# Patient Record
Sex: Male | Born: 1950 | Race: White | Hispanic: No | Marital: Married | Smoking: Former smoker
Health system: Southern US, Community
[De-identification: ages and names within clinical notes are randomized; demographics above are authoritative.]

## PROBLEM LIST (undated history)

## (undated) DIAGNOSIS — N401 Enlarged prostate with lower urinary tract symptoms: Secondary | ICD-10-CM

## (undated) DIAGNOSIS — N138 Other obstructive and reflux uropathy: Secondary | ICD-10-CM

## (undated) DIAGNOSIS — G4730 Sleep apnea, unspecified: Secondary | ICD-10-CM

## (undated) DIAGNOSIS — Z87891 Personal history of nicotine dependence: Secondary | ICD-10-CM

## (undated) DIAGNOSIS — R351 Nocturia: Secondary | ICD-10-CM

## (undated) DIAGNOSIS — R7989 Other specified abnormal findings of blood chemistry: Secondary | ICD-10-CM

## (undated) DIAGNOSIS — E785 Hyperlipidemia, unspecified: Secondary | ICD-10-CM

## (undated) DIAGNOSIS — M1990 Unspecified osteoarthritis, unspecified site: Secondary | ICD-10-CM

## (undated) DIAGNOSIS — R011 Cardiac murmur, unspecified: Secondary | ICD-10-CM

## (undated) HISTORY — DX: Sleep apnea, unspecified: G47.30

## (undated) HISTORY — DX: Unspecified osteoarthritis, unspecified site: M19.90

## (undated) HISTORY — PX: OTHER SURGICAL HISTORY: SHX169

## (undated) HISTORY — DX: Personal history of nicotine dependence: Z87.891

## (undated) HISTORY — DX: Hyperlipidemia, unspecified: E78.5

## (undated) HISTORY — DX: Nocturia: R35.1

## (undated) HISTORY — DX: Cardiac murmur, unspecified: R01.1

## (undated) HISTORY — DX: Other specified abnormal findings of blood chemistry: R79.89

---

## 1970-04-07 HISTORY — PX: HERNIA REPAIR: SHX51

## 1981-04-07 HISTORY — PX: VASECTOMY: SHX75

## 2005-04-07 HISTORY — PX: MOHS SURGERY: SUR867

## 2013-06-01 ENCOUNTER — Encounter: Payer: Self-pay | Admitting: Emergency Medicine

## 2013-06-01 ENCOUNTER — Encounter: Payer: Self-pay | Admitting: Family Medicine

## 2013-06-01 ENCOUNTER — Ambulatory Visit (INDEPENDENT_AMBULATORY_CARE_PROVIDER_SITE_OTHER): Payer: 59 | Admitting: Family Medicine

## 2013-06-01 ENCOUNTER — Ambulatory Visit (INDEPENDENT_AMBULATORY_CARE_PROVIDER_SITE_OTHER): Payer: 59 | Admitting: Emergency Medicine

## 2013-06-01 VITALS — BP 127/82 | HR 76 | Temp 97.9°F | Ht 69.0 in | Wt 210.5 lb

## 2013-06-01 VITALS — BP 131/82 | Ht 69.0 in | Wt 210.0 lb

## 2013-06-01 DIAGNOSIS — M25569 Pain in unspecified knee: Secondary | ICD-10-CM

## 2013-06-01 DIAGNOSIS — M25561 Pain in right knee: Secondary | ICD-10-CM

## 2013-06-01 DIAGNOSIS — R4589 Other symptoms and signs involving emotional state: Secondary | ICD-10-CM

## 2013-06-01 DIAGNOSIS — E785 Hyperlipidemia, unspecified: Secondary | ICD-10-CM

## 2013-06-01 DIAGNOSIS — F341 Dysthymic disorder: Secondary | ICD-10-CM

## 2013-06-01 NOTE — Assessment & Plan Note (Addendum)
Patient endorsing Right medial knee pain on exam.   Crepitus noted. Obtaining standing xray's today.  Patient to go to [facility] for [place] of knee to assess for underlying medial meniscus degeneration/tear. Patient declined NSAID's today.   Patient to follow up in 1 month.  After visit:  - I spoke with Dr. Meredith Staggers ([facility] fellow).  US revealed bulging right medial meniscus. No tear identified.

## 2013-06-01 NOTE — Progress Notes (Signed)
Subjective:    Patient ID: Cesar Green, male    DOB: 1950/04/10, 63 y.o.   MRN: [id number]  HPI Cesar Green presents to the clinic today to establish care.  PMH, Surgical Hx, Family Hx, Social Hx, Meds/Allergies reviewed (see below).  Past Medical History  Diagnosis Date  . HLD (hyperlipidemia)   . History of tobacco abuse     Quit 1976   Past Surgical History  Procedure Laterality Date  . Hernia repair  1972   Family History  Problem Relation Age of Onset  . Cancer Mother   . Heart failure Father   . Diabetes Father   . Depression Father   . Hypertension Mother   . Hypertension Father   . Hypertension Sister    History   Social History  . Marital Status: Married    Spouse Name: N/A    Number of Children: N/A  . Years of Education: N/A   Social History Main Topics  . Smoking status: Former Research scientist (life sciences)  . Smokeless tobacco: Not on file     Comment: Quit 1976  . Alcohol Use: Yes     Comment: Rarely; May have a been every 6 months  . Drug Use: No  . Sexual Activity: Yes   Other Topics Concern  . Not on file   Social History Narrative   Works for [employer].   Contact info: [phone]         Current outpatient prescriptions:aspirin 81 MG tablet, Take 81 mg by mouth daily., Disp: , Rfl: ;  pravastatin (PRAVACHOL) 80 MG tablet, , Disp: , Rfl:   No Known Allergies  Current issues:  1) Joint pain - Knee pain - Patient reports right knee pain (primary concern) - Has been going on for > 1 year - No recent injury or trauma - Pain moderate in severity and located medially. Pain is intermittent but has been worsening.  He is now unable to due his prior regular physical activity (cardio and martial arts). - He also notes that his knee feels "unsteady" - Pain improves slightly with PRN Tylenol Arthritis - He is eager to get this improved so that he can return to exercising regularly.  2) Sadness - Patient reports recent sadness  - Mood is now beginning to impact  sleep and personal relationships - Recent stressors: daughter has been sick (she is disabled), recent job change and relocation to the area. - No HI or SI - Believes this has worsened as he is now sedentary and unable to do regular physical activity (which is his personal time and stress reliever)   Review of Systems General:  Negative for nexplained weight loss, fever Skin: Negative for new or changing mole, sore that won't heal HEENT: Negative for trouble hearing, trouble seeing, mouth sores, hoarseness, change in voice, dysphagia. Reports longstanding tinnitus CV:  Negative for chest pain, dyspnea, edema, palpitations Resp: Negative for cough, dyspnea, hemoptysis GI: Negative for nausea, vomiting, diarrhea, constipation, abdominal pain, melena, hematochezia. GU: Negative for dysuria, incontinence, urinary hesitance, hematuria, vaginal or penile discharge, polyuria, sexual difficulty, lumps in testicle or breasts MSK: Negative for muscle cramps or aches. Reports joint pain/swelling.  Neuro: Negative for headaches, weakness, numbness, dizziness, passing out/fainting Psych: Reports sadness.   Objective:   Physical Exam Filed Vitals:   06/01/13 1338  BP: 127/82  Pulse: 76  Temp: 97.9 F (36.6 C)   Exam: General: well appearing gentleman, NAD; pleasant and cooperative. Cardiovascular: RRR. No murmurs, rubs, or gallops. Respiratory:  CTAB. No rales, rhonchi, or wheeze. Abdomen: soft, nontender, nondistended. Extremities: No LE edema. MSK:  Knee:  Normal to inspection with no erythema or effusion or obvious bony abnormalities.  Palpation normal with no warmth. Medial joint line tenderness.   Normal ROM in flexion and extension.  Ligaments with solid consistent endpoints including ACL, PCL, LCL, MCL.  Negative Mcmurray's.  Non painful patellar compression.  Crepitus noted with flexion/extension of knee.   Patellar and quadriceps tendons unremarkable. Psych: flat affect noted.       Assessment & Plan:  See Problem List

## 2013-06-01 NOTE — Assessment & Plan Note (Signed)
US showed question of degenerative changes to medial meniscus without definitive tear seen.   Patient awaiting xrays.  Given Body Helix compression sleeve.  Follow up with [facility] or Dr. Lacinda Axon after xrays and consider further imaging vs. inj therapy at that time.

## 2013-06-01 NOTE — Patient Instructions (Signed)
It was nice to see you today.  I am ordering an xray of your knee to further evaluate.  I have prescribed Mobic for your knee pain.  Follow up with me in ~ 1 month following your Sports medicine visit.   Continue taking your medications as prescribed.

## 2013-06-01 NOTE — Assessment & Plan Note (Addendum)
Likely secondary to recent life stressors.  No indication for pharmacotherapy currently. Will continue to monitor closely.

## 2013-06-01 NOTE — Assessment & Plan Note (Signed)
Continuing Pravachol 80 mg daily.

## 2013-06-01 NOTE — Progress Notes (Signed)
Patient ID: Cesar Green, male   DOB: [birth year], 63 y.o.   MRN: [id number] 63 yo male with right knee pain sent from family medicine for evaluation of right knee pain and ultrasound to evaluate for meniscal injury. Pain gradually worsening in knee.  No significant swelling.  Limiting his ability to run due to pain.  Onset after martial arts, although no specifice inciting event.  PPMH:  Hyperlipidemia, no diabetes.  ROS:  As per HPI, otherwise negative.  Examination: BP 131/82  Ht 5\' 9"  (1.753 m)  Wt 210 lb (95.255 kg)  BMI 31.00 kg/m2. Right Knee: Normal to inspection with no erythema or effusion or obvious bony abnormalities. Palpation with joint line tenderness medially ROM normal in flexion and extension and lower leg rotation. Ligaments with solid consistent endpoints including ACL, PCL, LCL, MCL. Equivocal Mcmurray's and provocative meniscal tests. Non painful patellar compression. Patellar and quadriceps tendons unremarkable. Hamstring and quadriceps strength is normal.  MSK [place] of right knee reveals buldging of medial mensicus with small amount of fluid.  No discreet tear is seen however.  Normal lateral meniscus and patellar tendon.

## 2013-06-10 ENCOUNTER — Other Ambulatory Visit: Payer: Self-pay | Admitting: Family Medicine

## 2013-06-10 ENCOUNTER — Ambulatory Visit
Admission: RE | Admit: 2013-06-10 | Discharge: 2013-06-10 | Disposition: A | Discharge status: Home / Self Care | Payer: 59 | Source: Ambulatory Visit | Attending: Family Medicine | Admitting: Family Medicine

## 2013-06-10 DIAGNOSIS — M25569 Pain in unspecified knee: Secondary | ICD-10-CM

## 2013-07-20 ENCOUNTER — Ambulatory Visit (INDEPENDENT_AMBULATORY_CARE_PROVIDER_SITE_OTHER): Payer: 59 | Admitting: Family Medicine

## 2013-07-20 VITALS — BP 122/64 | HR 76 | Temp 98.2°F | Ht 69.0 in | Wt 204.0 lb

## 2013-07-20 DIAGNOSIS — M25561 Pain in right knee: Secondary | ICD-10-CM

## 2013-07-20 DIAGNOSIS — M25569 Pain in unspecified knee: Secondary | ICD-10-CM

## 2013-07-20 MED ORDER — MELOXICAM 15 MG PO TABS: 15.0000 mg | ORAL_TABLET | Freq: Every day | ORAL | Status: DC

## 2013-07-20 NOTE — Assessment & Plan Note (Signed)
After discussion of therapeutic options patient elected for PRN Anti-inflammatories and continued compression sleeve. Rx for Mobic sent. If patient fails conservative management with proceed with corticosteroid injection.

## 2013-07-20 NOTE — Progress Notes (Signed)
   Subjective:    Patient ID: Marq Rebello, male    DOB: [birth year], 63 y.o.   MRN: [id number]  HPI 63 year old male presents for follow up of right knee pain.  1) Right Knee Pain - Patient reports intermittent and sharp R medial knee pain; 7-8/10 in severity.  No radiation. - Pain is worse with certain movements/activities. - He also reports that he has a frequent dull ache as well (2/10 in severity). - Associated symptoms: knee "buckling" with the above sharp pain.  No reported weakness. - Pain somewhat improved with sleeve/bracing  Review of Systems Per HPI    Objective:   Physical Exam Filed Vitals:   07/20/13 1346  BP: 122/64  Pulse: 76  Temp: 98.2 F (36.8 C)   General: well appearing male in NAD MSK: Knee: Normal to inspection with no erythema or effusion or obvious bony abnormalities. Palpation normal with no warmth, joint line tenderness, patellar tenderness, or condyle tenderness. ROM full in flexion and extension and lower leg rotation. Ligaments with solid consistent endpoints including ACL, PCL, LCL, MCL. Negative Mcmurray's. +Crepitus with flexion/extension. Patellar and quadriceps tendons unremarkable.    Assessment & Plan:  See Problem List

## 2013-07-20 NOTE — Patient Instructions (Signed)
It was nice to see you today.  Please take the Mobic daily as needed for knee pain/arthritis.  Follow up with me annually as needed.

## 2013-12-23 ENCOUNTER — Encounter: Payer: Self-pay | Admitting: Family Medicine

## 2013-12-23 ENCOUNTER — Ambulatory Visit (INDEPENDENT_AMBULATORY_CARE_PROVIDER_SITE_OTHER): Payer: 59 | Admitting: Family Medicine

## 2013-12-23 VITALS — BP 127/78 | HR 64 | Ht 69.0 in | Wt 197.0 lb

## 2013-12-23 DIAGNOSIS — E785 Hyperlipidemia, unspecified: Secondary | ICD-10-CM

## 2013-12-23 DIAGNOSIS — F3289 Other specified depressive episodes: Secondary | ICD-10-CM

## 2013-12-23 DIAGNOSIS — R4589 Other symptoms and signs involving emotional state: Secondary | ICD-10-CM

## 2013-12-23 DIAGNOSIS — Z0000 Encounter for general adult medical examination without abnormal findings: Secondary | ICD-10-CM

## 2013-12-23 DIAGNOSIS — Z23 Encounter for immunization: Secondary | ICD-10-CM

## 2013-12-23 DIAGNOSIS — E663 Overweight: Secondary | ICD-10-CM

## 2013-12-23 DIAGNOSIS — F329 Major depressive disorder, single episode, unspecified: Secondary | ICD-10-CM

## 2013-12-23 DIAGNOSIS — M25561 Pain in right knee: Secondary | ICD-10-CM

## 2013-12-23 DIAGNOSIS — G4700 Insomnia, unspecified: Secondary | ICD-10-CM | POA: Insufficient documentation

## 2013-12-23 DIAGNOSIS — Z79899 Other long term (current) drug therapy: Secondary | ICD-10-CM

## 2013-12-23 DIAGNOSIS — M25569 Pain in unspecified knee: Secondary | ICD-10-CM

## 2013-12-23 LAB — COMPLETE METABOLIC PANEL WITH GFR
ALT: 23 U/L (ref 0–53)
AST: 22 U/L (ref 0–37)
Albumin: 4.5 g/dL (ref 3.5–5.2)
Alkaline Phosphatase: 77 U/L (ref 39–117)
BILIRUBIN TOTAL: 0.7 mg/dL (ref 0.2–1.2)
BUN: 14 mg/dL (ref 6–23)
CO2: 28 meq/L (ref 19–32)
Calcium: 9.4 mg/dL (ref 8.4–10.5)
Chloride: 105 mEq/L (ref 96–112)
Creat: 1.01 mg/dL (ref 0.50–1.35)
GFR, EST NON AFRICAN AMERICAN: 78 mL/min
GLUCOSE: 92 mg/dL (ref 70–99)
Potassium: 4.2 mEq/L (ref 3.5–5.3)
Sodium: 142 mEq/L (ref 135–145)
Total Protein: 6.8 g/dL (ref 6.0–8.3)

## 2013-12-23 LAB — LIPID PANEL
CHOL/HDL RATIO: 3.7 ratio
CHOLESTEROL: 180 mg/dL (ref 0–200)
HDL: 49 mg/dL (ref >39)
LDL Cholesterol: 109 mg/dL — ABNORMAL HIGH (ref 0–99)
Triglycerides: 111 mg/dL (ref <150)
VLDL: 22 mg/dL (ref 0–40)

## 2013-12-23 LAB — CBC
HCT: 40.1 % (ref 39.0–52.0)
Hemoglobin: 14.1 g/dL (ref 13.0–17.0)
MCH: 29.7 pg (ref 26.0–34.0)
MCHC: 35.2 g/dL (ref 30.0–36.0)
MCV: 84.6 fL (ref 78.0–100.0)
PLATELETS: 287 10*3/uL (ref 150–400)
RBC: 4.74 MIL/uL (ref 4.22–5.81)
RDW: 13.7 % (ref 11.5–15.5)
WBC: 7.9 10*3/uL (ref 4.0–10.5)

## 2013-12-23 MED ORDER — ZOSTER VACCINE LIVE 19400 UNT/0.65ML ~~LOC~~ SOLR: 0.6500 mL | Freq: Once | SUBCUTANEOUS | Status: DC

## 2013-12-23 NOTE — Progress Notes (Signed)
   Subjective:    Patient ID: Cesar Green, male    DOB: 1950/09/23, 63 y.o.   MRN: [id number]  HPI 63 year old male with PMH of hyperlipidemia presents for an annual physical exam/wellness visit.  Cesar Green reports that he is doing well.  He does have a few issues to discuss today.  1) Difficulty sleeping - Patient reports that he has difficulty staying asleep. - He reports that he falls asleep easily but awakens early. - He is unclear of any potential triggers.   - He states that this has been going on for months.  - Denies exercise/physical activity before bed, reading/watching TV in bed.  He does note that his mind is restless and that he does drink a substantial amount of caffeine.  2) Irritability/Intermittently feeling down - He notes that he wife frequently reports that he is irritable  - He also reports that he feels down occasion.  He states that this occurs predominantly with things/events that trigger memories of his parents - Mood does not effect his relationships or work life. - No reported loss of interest, guilt, appetite changes, concentration difficulties, etc.  Sleep difficulties as above. - Of note, he states that he was prescribed an antihistamine in the past.   3) Hyperlipidemia - On pravastatin - Denies myalgias, weakness.  4) Preventative care - Colonoscopy performed in 2008. - Patient in need of flu vaccine, Zostavax, Tdap.  PMH, Surgical Hx, Family Hx, Social Hx reviewed. Past Medical History  Diagnosis Date  . HLD (hyperlipidemia)   . History of tobacco abuse     Quit 1976   Past Surgical History  Procedure Laterality Date  . Hernia repair  1972   Family History  Problem Relation Age of Onset  . Cancer Mother   . Heart failure Father   . Diabetes Father   . Depression Father   . Hypertension Mother   . Hypertension Father   . Hypertension Sister    History  Substance Use Topics  . Smoking status: Former Research scientist (life sciences)  . Smokeless tobacco:  Not on file     Comment: Quit 1976  . Alcohol Use: No    Review of Systems  General:  Negative for nexplained weight loss, fever Skin: Negative for new or changing mole, sore that won't heal HEENT: Negative for trouble hearing, trouble seeing, mouth sores, hoarseness, change in voice, dysphagia. + Ringing in the Ears. CV:  Negative for chest pain, dyspnea, edema, palpitations Resp: Negative for cough, dyspnea, hemoptysis GI: Negative for nausea, vomiting, diarrhea, constipation, abdominal pain, melena, hematochezia. GU: Negative for dysuria, incontinence, urinary hesitance, hematuria, vaginal or penile discharge, polyuria, sexual difficulty, lumps in testicle or breasts MSK: Negative for muscle cramps or aches. + Joint pain Neuro: Negative for headaches, weakness, numbness, dizziness, passing out/fainting Psych: Negative for depression, anxiety, memory problems. + Sadness.      Objective:   Physical Exam Filed Vitals:   12/23/13 1434  BP: 127/78  Pulse: 64   General: well appearing male in NAD.  HEENT: NCAT. Normal TM's bilaterally. Oropharynx clear.  Cardiovascular: RRR. No murmurs, rubs, or gallops. Respiratory: CTAB. No rales, rhonchi, or wheeze. Abdomen: soft, nontender, nondistended. No organomegaly.  Extremities: Trace LE edema.  Skin: Warm, dry, intact. Neuro: No focal deficits.     Assessment & Plan:  See Problem List

## 2013-12-23 NOTE — Assessment & Plan Note (Signed)
Patient given sleep hygiene tips/instructions today. Patient did not want pharmacotherapy. Will follow.

## 2013-12-23 NOTE — Assessment & Plan Note (Signed)
Tdap given today.  Rx for Zostavax given.  Colonoscopy needed in 2018.  Discussed PSA and DRE today and current guidelines (USPSTF) which do not advocate for this.  Will need [place] to evaluated for AAA next year (given smoking hx). Patient to get influenza vaccine at work.

## 2013-12-23 NOTE — Patient Instructions (Signed)
It was nice to see you today.  You're doing great.  Continue your statin.  Sleep hygiene: Basic rules for a good night's sleep  Sleep only as much as you need to feel rested and then get out of bed  Keep a regular sleep schedule  Avoid forcing sleep  Exercise regularly for at least 20 minutes, preferably 4 to 5 hours before bedtime  Avoid caffeinated beverages after lunch  Avoid alcohol near bedtime: no "night cap"  Avoid smoking, especially in the evening  Do not go to bed hungry  Adjust bedroom environment  Avoid prolonged use of light-emitting screens before bedtime   Deal with your worries before bedtime   Follow up annually.

## 2013-12-23 NOTE — Assessment & Plan Note (Signed)
Does not seem to be secondary to depression (only positive item of SIGECAPS is sleep change).  Related to things that trigger memories of family.  No indication for pharmacotherapy. Will continue to follow closely.

## 2013-12-23 NOTE — Assessment & Plan Note (Signed)
Lipid panel today

## 2013-12-24 ENCOUNTER — Encounter: Payer: Self-pay | Admitting: Family Medicine

## 2013-12-28 ENCOUNTER — Telehealth: Payer: Self-pay | Admitting: *Deleted

## 2013-12-28 NOTE — Telephone Encounter (Signed)
Left voice message that paper work is completed and ready for pick up.  Derl Barrow, RN

## 2014-05-18 ENCOUNTER — Encounter: Payer: Self-pay | Admitting: Family Medicine

## 2014-05-18 ENCOUNTER — Ambulatory Visit (INDEPENDENT_AMBULATORY_CARE_PROVIDER_SITE_OTHER): Payer: Self-pay | Admitting: Family Medicine

## 2014-05-18 VITALS — BP 121/71 | HR 71 | Temp 98.3°F | Ht 69.0 in | Wt 202.0 lb

## 2014-05-18 DIAGNOSIS — G4700 Insomnia, unspecified: Secondary | ICD-10-CM

## 2014-05-18 DIAGNOSIS — R519 Headache, unspecified: Secondary | ICD-10-CM | POA: Insufficient documentation

## 2014-05-18 DIAGNOSIS — R51 Headache: Secondary | ICD-10-CM

## 2014-05-18 MED ORDER — PRAVASTATIN SODIUM 80 MG PO TABS: 80.0000 mg | ORAL_TABLET | Freq: Every day | ORAL | Status: DC

## 2014-05-18 MED ORDER — NORTRIPTYLINE HCL 25 MG PO CAPS: 25.0000 mg | ORAL_CAPSULE | Freq: Every day | ORAL | Status: DC

## 2014-05-18 NOTE — Assessment & Plan Note (Signed)
I feel that this is secondary to mild depression given history and PHQ9 of 7. Will treat with nortriptyline 25 mg prior to bed and have patient follow closely.

## 2014-05-18 NOTE — Patient Instructions (Signed)
It was nice to see you today.  I have prescribed Nortriptyline for your insomnia/potential depression.  Follow up with me in ~ 1-2 months (either in the office or via phone).  Take care  Dr. Lacinda Axon

## 2014-05-18 NOTE — Assessment & Plan Note (Signed)
Patient recent headache 1. He reports that this headache was different than once had in the past. History suggestive of migraine headache. Given the fact that this was 1 isolated event, I advised him to use NSAIDs and if this recurs to follow up with me as imaging will likely be in order given his age and reported new type of headache.

## 2014-05-18 NOTE — Progress Notes (Signed)
   Subjective:    Patient ID: Cesar Green, male    DOB: March 29, 1951, 64 y.o.   MRN: [id number]  HPI 64 year old male with hyperlipidemia presents to the clinic today with complaints of insomnia and recent headache.  1) Insomnia  Patient reports that for the past 3 months she's been experiencing difficulty sleeping.  Patient reports that he does not have any trouble falling asleep but that he has difficulty staying asleep.  He reports he falls asleep easily and then sleeps for 2-3 hours and awakens.  Additionally, patient reports that he has been more irritable/snappy over these past 3 months. He states that this is impacting his work as well as his home life.  Also, his wife has expressed concern that he may be depressed.  She has noticed that he has had little interest in doing things.  Patient states that he does not feel down or depressed. He does note that he occasionally feels down but nothing out of the ordinary. As indicated above he does note he has been more irritable and is been experiencing lack of energy (he attributes this to insomnia).  He also reports that he had some difficulty concentrating at times.  He denies guilt, lack of interest/pleasure, suicidal ideation, appetite change.  2) Headache  Patient report he recently had a severe headache.  He reports that this was different from his prior.   He states that it was located in the glabellar region.  Pain was sharp and throbbing in character.  Questionable mild photophobia.  No reported nausea.  Pain was 8-9 out of 10 in severity.  Patient took Excedrin with improvement.  He has had no further headaches.  Review of Systems Per HPI    Objective:   Physical Exam Filed Vitals:   05/18/14 1350  BP: 121/71  Pulse: 71  Temp: 98.3 F (36.8 C)   Exam: General: well appearing, NAD. HEENT: NCAT. PEERLA.  Cardiovascular: RRR. No murmurs, rubs, or gallops. Respiratory: CTAB. No rales, rhonchi, or  wheeze. Abdomen: soft, nontender, nondistended. Neuro: CN 2-12 intact. 5/5 muscle strength. Sensation intact.    Assessment & Plan:  See Problem List

## 2014-06-02 ENCOUNTER — Other Ambulatory Visit: Payer: Self-pay | Admitting: *Deleted

## 2014-06-02 MED ORDER — PRAVASTATIN SODIUM 80 MG PO TABS: 80.0000 mg | ORAL_TABLET | Freq: Every day | ORAL | Status: DC

## 2014-06-02 MED ORDER — MELOXICAM 15 MG PO TABS: 15.0000 mg | ORAL_TABLET | Freq: Every day | ORAL | Status: DC

## 2014-06-02 MED ORDER — NORTRIPTYLINE HCL 25 MG PO CAPS: 25.0000 mg | ORAL_CAPSULE | Freq: Every day | ORAL | Status: DC

## 2014-07-03 ENCOUNTER — Other Ambulatory Visit: Payer: Self-pay | Admitting: Family Medicine

## 2014-12-15 ENCOUNTER — Encounter: Payer: Self-pay | Admitting: Family Medicine

## 2014-12-15 ENCOUNTER — Ambulatory Visit (INDEPENDENT_AMBULATORY_CARE_PROVIDER_SITE_OTHER): Payer: 59 | Admitting: Family Medicine

## 2014-12-15 VITALS — BP 135/69 | HR 63 | Temp 98.1°F | Ht 69.0 in | Wt 196.0 lb

## 2014-12-15 DIAGNOSIS — R3911 Hesitancy of micturition: Secondary | ICD-10-CM | POA: Diagnosis not present

## 2014-12-15 DIAGNOSIS — E785 Hyperlipidemia, unspecified: Secondary | ICD-10-CM | POA: Diagnosis not present

## 2014-12-15 DIAGNOSIS — Z0000 Encounter for general adult medical examination without abnormal findings: Secondary | ICD-10-CM | POA: Diagnosis not present

## 2014-12-15 NOTE — Assessment & Plan Note (Signed)
Up-to-date on colonoscopy and vaccines Lipid panel, bmet, A1c, hep C, HIV screening next week Patient declined prostate exam Follow-up in one year for next annual physical

## 2014-12-15 NOTE — Progress Notes (Signed)
   Subjective:   Cesar Green is a 64 y.o. male with a history of HLD, overweight here for annual physical.  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 All other systems reviewed and are negative.   PMH, PSH, Medications, Allergies, and FmHx reviewed and updated in EMR.  Social History: former smoker - 9 pack year history, quit 40 yrs ago  Objective:  BP 135/69 mmHg  Pulse 63  Temp(Src) 98.1 F (36.7 C) (Oral)  Ht 5\' 9"  (1.753 m)  Wt 196 lb (88.905 kg)  BMI 28.93 kg/m2  Gen:  64 y.o. male in NAD HEENT: NCAT, MMM, EOMI, PERRL, anicteric sclerae CV: RRR, no MRG Resp: Non-labored, CTAB, no wheezes noted Abd: Soft, NTND, BS present, no guarding or organomegaly Ext: WWP, no edema MSK: Full ROM, strength intact Neuro: Alert and oriented, speech normal     Assessment & Plan:     Cesar Green is a 64 y.o. male here for annual physical.  Hyperlipidemia Lipid panel on 9/19 Continue pravastatin 80 mg daily Continue aspirin 81 mg daily  Preventative health care Up-to-date on colonoscopy and vaccines Lipid panel, bmet, A1c, hep C, HIV screening next week Patient declined prostate exam Follow-up in one year for next annual physical  Urinary hesitancy Likely related to BPH Declined prostate exam today We'll continue to monitor and start Flomax if quality of life is more affected    Cesar Crews, MD  MPH PGY-2,  [facility] 12/15/2014  12:11 PM

## 2014-12-15 NOTE — Assessment & Plan Note (Signed)
Likely related to BPH Declined prostate exam today We'll continue to monitor and start Flomax if quality of life is more affected

## 2014-12-15 NOTE — Assessment & Plan Note (Signed)
Lipid panel on 9/19 Continue pravastatin 80 mg daily Continue aspirin 81 mg daily

## 2014-12-15 NOTE — Patient Instructions (Signed)
Nice to see you again today. We will get labs after than a year since last time he had them. Please make a lab appointment for 12/25/14 or later for these labs. I will send you a letter call you with the results when they're available.  Come back to see me in one year for your next physical or sooner if any problems arise.  Take care,  Dr. B  Things to do to keep yourself healthy  - Exercise at least 30-45 minutes a day, 3-4 days a week.  - Eat a low-fat diet with lots of fruits and vegetables, up to 7-9 servings per day.  - Seatbelts can save your life. Wear them always.  - Smoke detectors on every level of your home, check batteries every year.  - Eye Doctor - have an eye exam every 1-2 years  - Safe sex - if you may be exposed to STDs, use a condom.  - Alcohol -  If you drink, do it moderately, less than 2 drinks per day.  - [place]. Choose someone to speak for you if you are not able.  - Depression is common in our stressful world.If you're feeling down or losing interest in things you normally enjoy, please come in for a visit.  - Violence - If anyone is threatening or hurting you, please call immediately.

## 2014-12-25 ENCOUNTER — Other Ambulatory Visit (INDEPENDENT_AMBULATORY_CARE_PROVIDER_SITE_OTHER): Payer: 59

## 2014-12-25 DIAGNOSIS — E785 Hyperlipidemia, unspecified: Secondary | ICD-10-CM

## 2014-12-25 DIAGNOSIS — Z0000 Encounter for general adult medical examination without abnormal findings: Secondary | ICD-10-CM

## 2014-12-25 LAB — LIPID PANEL
CHOL/HDL RATIO: 4 ratio (ref <=5.0)
Cholesterol: 185 mg/dL (ref 125–200)
HDL: 46 mg/dL (ref >=40)
LDL CALC: 114 mg/dL (ref <130)
Triglycerides: 123 mg/dL (ref <150)
VLDL: 25 mg/dL (ref <30)

## 2014-12-25 LAB — BASIC METABOLIC PANEL WITH GFR
BUN: 13 mg/dL (ref 7–25)
CHLORIDE: 106 mmol/L (ref 98–110)
CO2: 28 mmol/L (ref 20–31)
CREATININE: 0.95 mg/dL (ref 0.70–1.25)
Calcium: 8.6 mg/dL (ref 8.6–10.3)
GFR, Est African American: >89 mL/min (ref >=60)
GFR, Est Non African American: 84 mL/min (ref >=60)
GLUCOSE: 89 mg/dL (ref 65–99)
Potassium: 4.1 mmol/L (ref 3.5–5.3)
SODIUM: 144 mmol/L (ref 135–146)

## 2014-12-25 LAB — POCT GLYCOSYLATED HEMOGLOBIN (HGB A1C): HEMOGLOBIN A1C: 6

## 2014-12-25 NOTE — Progress Notes (Signed)
Cesar Green

## 2014-12-26 ENCOUNTER — Encounter: Payer: Self-pay | Admitting: Family Medicine

## 2014-12-26 LAB — HIV ANTIBODY (ROUTINE TESTING W REFLEX): HIV: NONREACTIVE

## 2014-12-26 LAB — HEPATITIS C ANTIBODY: HCV AB: NEGATIVE

## 2015-05-24 ENCOUNTER — Telehealth: Payer: Self-pay | Admitting: *Deleted

## 2015-05-24 NOTE — Telephone Encounter (Signed)
Called patient to offer flu vaccine. Patient states he received flu vaccine at [employer] (workplace) mid-October, 2016. Added to historical immunization record. Velora Heckler, RN

## 2015-06-05 ENCOUNTER — Other Ambulatory Visit: Payer: Self-pay | Admitting: *Deleted

## 2015-06-06 MED ORDER — PRAVASTATIN SODIUM 80 MG PO TABS: 80.0000 mg | ORAL_TABLET | Freq: Every day | ORAL | Status: DC

## 2015-07-26 ENCOUNTER — Other Ambulatory Visit: Payer: Self-pay | Admitting: Family Medicine

## 2015-07-26 MED ORDER — MELOXICAM 15 MG PO TABS: ORAL_TABLET | ORAL | Status: DC

## 2015-09-18 ENCOUNTER — Other Ambulatory Visit: Payer: Self-pay | Admitting: Family Medicine

## 2015-12-26 ENCOUNTER — Ambulatory Visit (INDEPENDENT_AMBULATORY_CARE_PROVIDER_SITE_OTHER): Payer: 59 | Admitting: Family Medicine

## 2015-12-26 ENCOUNTER — Encounter: Payer: Self-pay | Admitting: Family Medicine

## 2015-12-26 ENCOUNTER — Telehealth: Payer: Self-pay | Admitting: *Deleted

## 2015-12-26 VITALS — BP 112/67 | HR 65 | Temp 97.8°F | Ht 69.0 in | Wt 198.8 lb

## 2015-12-26 DIAGNOSIS — Z23 Encounter for immunization: Secondary | ICD-10-CM

## 2015-12-26 DIAGNOSIS — Z0000 Encounter for general adult medical examination without abnormal findings: Secondary | ICD-10-CM

## 2015-12-26 DIAGNOSIS — E349 Endocrine disorder, unspecified: Secondary | ICD-10-CM

## 2015-12-26 DIAGNOSIS — E291 Testicular hypofunction: Secondary | ICD-10-CM

## 2015-12-26 MED ORDER — TESTOSTERONE 2 MG/24HR TD PT24: 2.0000 mg | MEDICATED_PATCH | Freq: Every day | TRANSDERMAL | 1 refills | Status: DC

## 2015-12-26 NOTE — Assessment & Plan Note (Addendum)
Testosterone level low 182 on recent labs drawn at work (11/19/15) -was told by nurse at work to discuss with PCP -symptomatic with low energy levels, loss of interest in activities he once enjoyed, however sex drive has not been diminished -prescribed testosterone patch 2 mg daily today -continue to monitor -counseled on adverse effects of medication -follow up in 3 months to recheck testosterone level and evaluate for clinical improvement

## 2015-12-26 NOTE — Progress Notes (Signed)
Subjective:   Patient ID: Cesar Green    DOB: [birth year], 65 y.o. male   MRN: [id number]  CC: routine follow-up and concerns for low testosterone on labs  HPI: Cesar Green is a 65 y.o. male who presents to clinic today for a routine checkup and concerns for low testosterone.  He had his blood work done recently at work and was told by the nurse that his cholesterol numbers were high and his testosterone was "bottomed out."  He states he had not properly fasted prior to those labs and is here today fasted since last night to repeat them.  He states he has been feeling very irritable and tired over the past few years.  He has always looked forward to exercise and does so daily.  However, lately he feels as though he has to force himself to even get to the gym.  States he also has lost interest in other hobbies he used to enjoy such as woodwork and activities with his wife ie shopping and reading. He states his sex drive has not diminished however and he continues to have a good relationship with his wife.  Has been married x41 years.  Quit smoking 40 years ago.  He reports feeling tired all the time and feels as though he gets poor quality sleep. As a result ends up feeling irritable and groggy at work.  Denies feeling depressed and does not feel as though these symptoms interfere with his interpersonal relationships at home or at work.  He reports he was out of shape about 8 years ago and lost a significant amount of weight.  He went down on his weight from 217 lbs to 169 lbs.  Currently 199 lbs.  States that back then it was much easier for him to lose weight and nowadays even though he exercises and tries to eat well he has been unable to lose a few pounds.  Reports he eats lean meats, loves vegetables, whey protein, and has cut down on sugary foods he enjoys such as ice cream. Appears to have a good understanding of his health and is on the right track with his diet and exercise.  ROS: See HPI  for pertinent ROS. All other systems reviewed and negative.   Charlo: Pertinent past medical, surgical, family, and social history were reviewed and updated as appropriate. Smoking status reviewed.  Medications reviewed. Current Outpatient Prescriptions  Medication Sig Dispense Refill  . aspirin 81 MG tablet Take 81 mg by mouth daily.    . meloxicam (MOBIC) 15 MG tablet Take 1 tablet by mouth  daily 90 tablet 1  . nortriptyline (PAMELOR) 25 MG capsule Take 1 capsule (25 mg total) by mouth daily. 30 capsule 3  . pravastatin (PRAVACHOL) 80 MG tablet Take 1 tablet (80 mg total) by mouth daily. 90 tablet 3  . Testosterone 2 MG/24HR PT24 Place 2 mg onto the skin daily. 60 patch 1  . zoster vaccine live, PF, (ZOSTAVAX) 16109 UNT/0.65ML injection Inject 19,400 Units into the skin once. 1 each 0   No current facility-administered medications for this visit.    Objective:   BP 112/67   Pulse 65   Temp 97.8 F (36.6 C) (Oral)   Ht 5\' 9"  (1.753 m)   Wt 198 lb 12.8 oz (90.2 kg)   BMI 29.36 kg/m  Vitals and nursing note reviewed.  General: well nourished, well developed, in no acute distress with non-toxic appearance HEENT: NCAT, MMM Neck: supple, non-tender without  LAD CV: RRR, no m/r/g Lungs: CTA B/L with normal work of breathing Abdomen: soft, NT. ND, no masses or organomegaly palpable, +bs Skin: warm, dry, no rashes, cap refill < 2 seconds Extremities: warm and well perfused, normal tone  Assessment & Plan:   65 y/o M presents to clinic for routine health visit and concerns for low testosterone level on recent labs drawn at work  Hypotestosteronemia Testosterone level low 182 on recent labs drawn at work (11/19/15) -was told by nurse at work to discuss with PCP -symptomatic with low energy levels, loss of interest in activities he once enjoyed, however sex drive has not been diminished -prescribed testosterone patch 2 mg daily today -continue to monitor -counseled on adverse  effects of medication -follow up in 3 months to recheck testosterone level and evaluate for clinical improvement  1. Screened for depression on this visit, given his symptoms of low energy, poor sleep and loss of interest in hobbies.   2. Re-checked lipids and FBG today given he had eaten prior to labs being done on 11/19/15 and labs may not be accurate -will call patient with results  3. Health forms for work filled out  4. Flu vaccine administered during this visit.  5. PCV13 (1 of 2) also administered today.  Orders Placed This Encounter  Procedures  . Pneumococcal conjugate vaccine 13-valent  . Flu Vaccine QUAD 36+ mos IM  . Lipid panel  . Glucose, Fasting   Meds ordered this encounter  Medications  . Testosterone 2 MG/24HR PT24    Sig: Place 2 mg onto the skin daily.    Dispense:  60 patch    Refill:  1    Follow up: 3 months or sooner if necessary  Lovenia Kim, MD [facility], PGY-1 12/26/2015 3:57 PM

## 2015-12-26 NOTE — Patient Instructions (Addendum)
It was a pleasure meeting you today! You were seen in clinic for a routine check-up and blood work.  You also discussed your concern of low testosterone and were prescribed a daily patch of 2 mg.  I will call you with the results of your blood work this week.  You can follow up in 3 months or sooner if needed to discuss if the patch has been helping you and we can decide at that time whether or not to continue it.  You were given your flu vaccine and PCV13 shots today as well.  Thank you for allowing [place] to participate in your care.

## 2015-12-26 NOTE — Telephone Encounter (Signed)
Patient seen in clinic today and given Rx for Androderm 2 mg patch.  Prior authorization is required per insurance.  PA form completed by Dr. Reesa Chew.  Form faxed to [facility] for review. Review process could take 24-72 hours to complete. Derl Barrow, RN

## 2015-12-27 LAB — GLUCOSE, FASTING: GLUCOSE, FASTING: 85 mg/dL (ref 65–99)

## 2015-12-27 LAB — LIPID PANEL
CHOL/HDL RATIO: 3.8 ratio (ref <=5.0)
Cholesterol: 222 mg/dL — ABNORMAL HIGH (ref 125–200)
HDL: 59 mg/dL (ref >=40)
LDL CALC: 143 mg/dL — AB (ref <130)
Triglycerides: 101 mg/dL (ref <150)
VLDL: 20 mg/dL (ref <30)

## 2016-01-02 NOTE — Telephone Encounter (Signed)
Called to check the status of PA for Androdem patch.  PA is still pending per [facility].  Derl Barrow, RN

## 2016-01-02 NOTE — Telephone Encounter (Signed)
PA for Androderm patch was denied via [employer].  PA reference number: [id number].  Denial placed in provider box for review.  Derl Barrow, RN

## 2016-01-04 NOTE — Telephone Encounter (Signed)
Patient calls inquiring about the PA for Androderm patch. I advised him that they had been denied and now was waiting on Dr. Reesa Green to review and confirm the next step. Please call patient.

## 2016-01-08 NOTE — Telephone Encounter (Signed)
Pt called because he was prescribed Androderm patch, It was denied by his insurance. He received a letter stating why they denied this but his is confused because he doesn't have any of the symptoms or illnesses that would cause him harm. Please call patient to discuss. jw

## 2016-01-08 NOTE — Telephone Encounter (Signed)
Will forward to Dr. Reesa Chew, who prescribed the medication and given the denial letter.  Derl Barrow, RN

## 2016-01-11 NOTE — Telephone Encounter (Signed)
Thank you for following up with this! Tried calling the patient last week but did not pick up.  Spoke to [facility] rep and will work on a possible appeal or may have to have one more low testosterone reading.  Will attempt to call patient again tomorrow to explain this.

## 2016-01-14 ENCOUNTER — Telehealth: Payer: Self-pay | Admitting: Family Medicine

## 2016-01-14 NOTE — Telephone Encounter (Signed)
Pt states Dr. Reesa Chew called a few moments ago, about prior authorization for a new medication. Insurance will only cover 30 day supply with refills. Pt is using [facility] on the corner of [address]. Pt would like to be called after it has been called in. Please advise. Thanks! ep

## 2016-01-17 NOTE — Telephone Encounter (Signed)
Called patient to tell him that I spoke with [facility] and his pharmacy regarding his medication.  Was advised to re-submit his prior authorization as his insurance would not cover.  I was able to get it approved and have called it in to his pharmacy ([facility] on [address]).  They will order the medication and it should be here tomorrow or by Monday, latest.  Called the patient and left a VM to let him know this.  Pharmacy will also be calling patient to let him know when it is available.  Thanks!

## 2016-01-17 NOTE — Telephone Encounter (Signed)
Received a fax from [employer] needing additional information for the prior authorization for Androderm patch.  Fax placed in provider box for review.  Derl Barrow, RN

## 2016-01-17 NOTE — Telephone Encounter (Signed)
Called [facility] [facility] to re-do the PA. Was able to get it approved.  Have called it into the pharmacy [facility]) and patient will get a call when it is available (either tomorrow or Monday). Thanks!

## 2016-03-03 ENCOUNTER — Telehealth: Payer: Self-pay | Admitting: Family Medicine

## 2016-03-03 ENCOUNTER — Ambulatory Visit: Payer: 59 | Admitting: Family Medicine

## 2016-03-03 NOTE — Telephone Encounter (Signed)
Pt states he has broken out in hives in the upper part of this body. Also the places he was putting his Testerone patches are irritated and looks like burn makes. He doesn't now this is an allergic reaction to the medicine or the adhesive on the patch

## 2016-03-03 NOTE — Telephone Encounter (Signed)
Return call to patient regarding medication reaction.  Patient has been using the testerone patches x 1 month.  He noticed when removing the patches, small streaks of blood.  Patient is not sure if it is the patch or the adhesive around it.  Patient stopped using the patches last Thursday.  He has been using Benadryl and hydrocortisone cream.  Offered an appointment for today, but decline due to work schedule.  Patient wanted to be seen on Wednesday.  Will forward to PCP.  Derl Barrow, RN

## 2016-03-04 NOTE — Telephone Encounter (Signed)
Can we put patient on schedule for SDA tomorrow? I think I am SDA doc.  Virginia Crews, MD, MPH PGY-3,  [facility] 03/04/2016 9:02 AM

## 2016-03-04 NOTE — Telephone Encounter (Signed)
Pt already has an appt with dr Rosalyn Gess tomorrow. Deseree Kennon Holter, CMA

## 2016-03-05 ENCOUNTER — Encounter: Payer: Self-pay | Admitting: Family Medicine

## 2016-03-05 ENCOUNTER — Ambulatory Visit (INDEPENDENT_AMBULATORY_CARE_PROVIDER_SITE_OTHER): Payer: 59 | Admitting: Family Medicine

## 2016-03-05 VITALS — BP 114/66 | HR 67 | Temp 97.6°F | Ht 69.0 in | Wt 200.4 lb

## 2016-03-05 DIAGNOSIS — E291 Testicular hypofunction: Secondary | ICD-10-CM

## 2016-03-05 DIAGNOSIS — R7989 Other specified abnormal findings of blood chemistry: Secondary | ICD-10-CM

## 2016-03-05 DIAGNOSIS — L231 Allergic contact dermatitis due to adhesives: Secondary | ICD-10-CM | POA: Diagnosis not present

## 2016-03-05 DIAGNOSIS — E349 Endocrine disorder, unspecified: Secondary | ICD-10-CM | POA: Diagnosis not present

## 2016-03-05 MED ORDER — TESTOSTERONE 12.5 MG/ACT (1%) TD GEL: TRANSDERMAL | 3 refills | Status: DC

## 2016-03-05 NOTE — Patient Instructions (Addendum)
Cesar Green, who came in today for concern of a rash that he developed for testosterone patches.  These look like contact dermatitis, which is a form of eczema.  These spots will self resolve.  I would stop taking these patches.    Also, we discussed the need for testosterone replacement and in my opinion you do not need to be on testosterone.  However, I have prescribed you an alternative testosterone replacement which is a gel, that you will apply 4 pumps daily.  I have put in an order to check her levels today.  I will contact you with these results.  Again, I would recommend against continuing the replacement as you do not need it.   Very nice meeting you, Quillian Quince L. Rosalyn Gess, [facility] Resident PGY-1 03/05/2016 2:47 PM

## 2016-03-05 NOTE — Progress Notes (Signed)
    Subjective:  Cesar Green is a 65 y.o. male who presents to the [facility] today for evaluation for a medication reaction to testosterone patch  HPI:  Medication reaction: Patient found to have testosterone levels of 182 and was prescribed 2mg  testosterone patch in Mid October. Started seeing skin reaction almost immediately.  He develops itchy red patches in the location of the patch and in the same shape.  Some lesions have persisted for weeks.  Last week he noticed some very minor bleeding at the site and has not used any patches since. Of note, he does have a band-aid allergy,  He denies any wheezing, SOB, swelling of his throat, tongue or lips.  The lesions are non-painful.    PMH: insomnia, hyperlipidemia Tobacco use: never smoker Medication: reviewed and updated ROS: see HPI   Objective:  Physical Exam: BP 114/66 (BP Location: Left Arm, Patient Position: Sitting, Cuff Size: Normal)   Pulse 67   Temp 97.6 F (36.4 C) (Oral)   Ht 5\' 9"  (1.753 m)   Wt 200 lb 6.4 oz (90.9 kg)   SpO2 97%   BMI 29.59 kg/m   Gen: 65yo M in NAD, resting comfortably CV: RRR with no murmurs appreciated Pulm: NWOB, CTAB with no crackles, wheezes, or rhonchi Skin: red, patchy and pruritic rash with defined borders on right and left lower abdomen and right and left shoulders.  All lesions in the shape of a square.   Neuro: grossly normal, moves all extremities Psych: Normal affect and thought content  Results for orders placed or performed in visit on 03/05/16 (from the past 72 hour(s))  Testosterone Total,Free,Bio, Males     Status: Abnormal   Collection Time: 03/05/16  3:02 PM  Result Value Ref Range   Testosterone 256 250 - 827 ng/dL    Comment: Men with clinically significant hypogonadal symptoms and testosterone values repeatedly less than approximately 300 ng/dL may benefit from testosterone treatment after adequate risk and benefits counseling.    Albumin 4.3 3.6 - 5.1 g/dL   Sex Hormone  Binding 33 22 - 77 nmol/L   Testosterone, Free 32.4 (L) 46.0 - 224.0 pg/mL   Testosterone, Bioavailable 63.8 (L) 110.0 - 575.0 ng/dL     Assessment/Plan:  Contact dermatitis Patient recently started on testosterone patch 2mg  and immediately developed itching red patchy lesions in the shape of the patch.  He has stopped wearing the patch one week ago.  Very likely to be contact dermatitis and that this will resolve with cessation of wearing the patch.  - stop wearing patch - can consider other alternatives such as gel  Hypotestosteronemia Patient presenting with contact dermatitis 2/2 testosterone patch and after discussing other options with patient and the new concerns about the gel (contact precautions with wife and daughter) and he has reservations. I discussed my feelings of testosterone replacement with him and told him that he does not really need to be on this medication and I would recommend against it.  - did refill gel for patient and gave him the option of filling it - rechecked free testosterone and will plan to discuss results and plan with him

## 2016-03-06 DIAGNOSIS — L259 Unspecified contact dermatitis, unspecified cause: Secondary | ICD-10-CM | POA: Insufficient documentation

## 2016-03-06 LAB — TESTOSTERONE TOTAL,FREE,BIO, MALES
Albumin: 4.3 g/dL (ref 3.6–5.1)
Sex Hormone Binding: 33 nmol/L (ref 22–77)
TESTOSTERONE: 256 ng/dL (ref 250–827)
Testosterone, Bioavailable: 63.8 ng/dL — ABNORMAL LOW (ref 110.0–575.0)
Testosterone, Free: 32.4 pg/mL — ABNORMAL LOW (ref 46.0–224.0)

## 2016-03-06 NOTE — Assessment & Plan Note (Addendum)
Patient presenting with contact dermatitis 2/2 testosterone patch and after discussing other options with patient and the new concerns about the gel (contact precautions with wife and daughter) and he has reservations. I discussed my feelings of testosterone replacement with him and told him that he does not really need to be on this medication and I would recommend against it.  - did refill gel for patient and gave him the option of filling it - rechecked free testosterone and will plan to discuss results and plan with him

## 2016-03-06 NOTE — Assessment & Plan Note (Signed)
Patient recently started on testosterone patch 2mg  and immediately developed itching red patchy lesions in the shape of the patch.  He has stopped wearing the patch one week ago.  Very likely to be contact dermatitis and that this will resolve with cessation of wearing the patch.  - stop wearing patch - can consider other alternatives such as gel

## 2016-03-12 ENCOUNTER — Encounter: Payer: Self-pay | Admitting: Family Medicine

## 2016-04-28 ENCOUNTER — Other Ambulatory Visit: Payer: Self-pay | Admitting: Family Medicine

## 2016-05-02 ENCOUNTER — Telehealth: Payer: Self-pay | Admitting: *Deleted

## 2016-05-22 ENCOUNTER — Encounter: Payer: Self-pay | Admitting: Family Medicine

## 2016-05-22 ENCOUNTER — Ambulatory Visit (INDEPENDENT_AMBULATORY_CARE_PROVIDER_SITE_OTHER): Payer: 59 | Admitting: Family Medicine

## 2016-05-22 VITALS — BP 132/78 | HR 68 | Temp 98.0°F | Ht 69.0 in | Wt 203.2 lb

## 2016-05-22 DIAGNOSIS — R079 Chest pain, unspecified: Secondary | ICD-10-CM

## 2016-05-22 DIAGNOSIS — R351 Nocturia: Secondary | ICD-10-CM

## 2016-05-22 DIAGNOSIS — R1013 Epigastric pain: Secondary | ICD-10-CM

## 2016-05-22 MED ORDER — OMEPRAZOLE 40 MG PO CPDR: 40.0000 mg | DELAYED_RELEASE_CAPSULE | Freq: Every day | ORAL | 3 refills | Status: DC

## 2016-05-22 MED ORDER — TAMSULOSIN HCL 0.4 MG PO CAPS: 0.4000 mg | ORAL_CAPSULE | Freq: Every day | ORAL | 3 refills | Status: DC

## 2016-05-22 NOTE — Progress Notes (Signed)
Pre visit review using our clinic review tool, if applicable. No additional management support is needed unless otherwise documented below in the visit note. 

## 2016-05-24 ENCOUNTER — Encounter: Payer: Self-pay | Admitting: Family Medicine

## 2016-05-24 NOTE — Progress Notes (Signed)
Cesar Green is a 66 y.o. male is here to [facility].   History of Present Illness:    1. Heartburn. Frequent. Epigastric to mid chest. Worse when he goes to the gym. States that he takes a Zantac every time he goes now to see if it will help. Does not always. Denies SOB, palpitations, dizziness, N/V, back pain. Former smoker, quit 1976. No ETOH. Exercises frequently - with goal to get HR >150. Does aerobics and HIIT. Hx of HLD, on statin.   2. Nocturia. Hx of urinary stream changes, but previously unwilling to start medication. Now, affecting sleep and ready to start medication. Genito-Urinary ROS: negative for - dysuria, erectile dysfunction, genital discharge, hematuria, incontinence, scrotal mass/pain or urinary frequency/urgency.    PMHx, SurgHx, SocialHx, Medications, and Allergies were reviewed in the Visit Navigator and updated as appropriate.    Past Medical History:  Diagnosis Date  . History of tobacco abuse    Quit 1976  . HLD (hyperlipidemia)   . Low testosterone in male   . Nocturia   . OA (osteoarthritis)     Past Surgical History:  Procedure Laterality Date  . HERNIA REPAIR  1972    Family History  Problem Relation Age of Onset  . Cancer Mother   . Hypertension Mother   . Heart failure Father   . Diabetes Father   . Depression Father   . Hypertension Father   . Hypertension Sister     Social History  Substance Use Topics  . Smoking status: Former Research scientist (life sciences)  . Smokeless tobacco: Never Used     Comment: Quit 1976  . Alcohol use No     Current Medications and Allergies:    Current Outpatient Prescriptions:  .  aspirin 81 MG tablet, Take 81 mg by mouth daily., Disp: , Rfl:  .  meloxicam (MOBIC) 15 MG tablet, Take 1 tablet by mouth  daily, Disp: 90 tablet, Rfl: 1 .  nortriptyline (PAMELOR) 25 MG capsule, Take 1 capsule (25 mg total) by mouth daily., Disp: 30 capsule, Rfl: 3 .  pravastatin (PRAVACHOL) 80 MG tablet, TAKE 1 TABLET BY MOUTH   DAILY, Disp: 90 tablet, Rfl: 2    Allergies  Allergen Reactions  . Latex Rash  . Testosterone Hives     Patient Information Form: Screening and ROS    Do you feel safe in relationships? yes PHQ-2: negative  Review of Systems  General:  Negative for unexplained weight loss, fever Skin: Negative for new or changing mole, sore that won't heal HEENT: Negative for trouble hearing, trouble seeing, ringing in ears, mouth sores, hoarseness, change in voice, dysphagia CV:  Negative for dyspnea, edema, palpitations Resp: Negative for cough, dyspnea, hemoptysis GI: Negative for nausea, vomiting, diarrhea, constipation, melena, hematochezia GU: Negative for dysuria, incontinence, urinary hesitance, hematuria, penile discharge, polyuria MSK: Negative for muscle cramps or aches, joint pain or swelling Neuro: Negative for headaches, weakness, numbness, dizziness, passing out/fainting Psych: Negative for depression, anxiety, memory problems   Vitals:   Vitals:   05/22/16 1010  BP: 132/78  Pulse: 68  Temp: 98 F (36.7 C)  TempSrc: Oral  SpO2: 97%  Weight: 203 lb 3.2 oz (92.2 kg)  Height: 5\' 9"  (1.753 m)     Body mass index is 30.01 kg/m.   Physical Exam:     General: Alert, cooperative, appears stated age and no distress.  HEENT:  Normocephalic, without obvious abnormality, atraumatic. Conjunctivae/corneas clear. PERRL, EOM's intact. Normal TM's and external ear  canals both ears. Nares normal. Septum midline. Mucosa normal. No drainage or sinus tenderness. Lips, mucosa, and tongue normal; teeth and gums normal.  Lungs: Clear to auscultation bilaterally.  Heart:: Regular rate and rhythm, S1, S2 normal, no murmur, click, rub or gallop.  Abdomen: Soft, non-tender; bowel sounds normal; no masses,  no organomegaly.  Extremities: Extremities normal, atraumatic, no cyanosis or edema.  Pulses: 2+ and symmetric.  Skin: Skin color, texture, turgor normal. No rashes or lesions.    Neurologic: Alert and oriented X 3, normal strength and tone. Normal symmetric. reflexes. Normal coordination and gait.  Psych: Alert,oriented, in NAD with a full range of affect, normal behavior and no psychotic features   EKG: sinus bradycardia.    Assessment and Plan:    Cesar Green was seen today for establish care.  Diagnoses and all orders for this visit:  Chest pain, unspecified type Comments: "Heartburn" concerning for cardiac equivalent. EKG today. To Cardiology for further evaluation. Orders: -     EKG 12-Lead -     Ambulatory referral to Cardiology  Dyspepsia Comments: Trial of PPI. Orders: -     omeprazole (PRILOSEC) 40 MG capsule; Take 1 capsule (40 mg total) by mouth daily.  Nocturia -     tamsulosin (FLOMAX) 0.4 MG CAPS capsule; Take 1 capsule (0.4 mg total) by mouth daily.  . Reviewed expectations re: course of current medical issues. . Discussed self-management of symptoms. . Outlined signs and symptoms indicating need for more acute intervention. . Patient verbalized understanding and all questions were answered. . See orders for this visit as documented in the electronic medical record. . Patient received an After Visit Summary.  Records requested if needed. I spent 45 minutes with this patient, greater than 50% was face-to-face time counseling regarding the above diagnoses.    Briscoe Deutscher, [place], [place] 05/24/2016   Follow-up: No Follow-up on file.  Meds ordered this encounter  Medications  . tamsulosin (FLOMAX) 0.4 MG CAPS capsule    Sig: Take 1 capsule (0.4 mg total) by mouth daily.    Dispense:  30 capsule    Refill:  3  . omeprazole (PRILOSEC) 40 MG capsule    Sig: Take 1 capsule (40 mg total) by mouth daily.    Dispense:  30 capsule    Refill:  3   Medications Discontinued During This Encounter  Medication Reason  . zoster vaccine live, PF, (ZOSTAVAX) 91478 UNT/0.65ML injection Error  . Testosterone 12.5 MG/ACT (1%)  GEL Error   Orders Placed This Encounter  Procedures  . Ambulatory referral to Cardiology  . EKG 12-Lead

## 2016-05-25 ENCOUNTER — Encounter: Payer: Self-pay | Admitting: Family Medicine

## 2016-05-28 NOTE — Telephone Encounter (Signed)
Pt transferred care to [place]

## 2016-05-29 ENCOUNTER — Ambulatory Visit (INDEPENDENT_AMBULATORY_CARE_PROVIDER_SITE_OTHER): Payer: 59 | Admitting: Cardiology

## 2016-05-29 ENCOUNTER — Encounter: Payer: Self-pay | Admitting: Cardiology

## 2016-05-29 ENCOUNTER — Encounter (INDEPENDENT_AMBULATORY_CARE_PROVIDER_SITE_OTHER): Payer: Self-pay

## 2016-05-29 VITALS — BP 124/64 | HR 81 | Ht 69.0 in | Wt 204.4 lb

## 2016-05-29 DIAGNOSIS — R079 Chest pain, unspecified: Secondary | ICD-10-CM

## 2016-05-29 NOTE — Progress Notes (Signed)
Electrophysiology Office Note   Date:  05/29/2016   ID:  Burak, Custodio 1951/03/06, MRN [id number]  PCP:  Briscoe Deutscher, DO Primary Electrophysiologist:  Brittain Smithey Meredith Leeds, MD    Chief Complaint  Patient presents with  . Chest Pain     History of Present Illness: Cesar Green is a 66 y.o. male who presents today for electrophysiology evaluation.   History of epigastric mid chest pain. He says that it is worse when he goes to the gym. He takes Zantac every time he goes to see if Tasheena [place] help but it is not always cause improvement. He does not have shortness of breath, palpitations, dizziness, nausea vomiting associated with this. He is a former smoker but quit in 1976. He says that he feels like the chest pain is due to his reflux. The pain is not reproducible. He says that when he takes his Zantac prior to exercising that he does not have any episodes of chest pain. He also says that he is able to get his heart rate up to 150 beats a minute for many minutes during exercise which does not cause chest pain.   Today, he denies symptoms of palpitations, shortness of breath, orthopnea, PND, lower extremity edema, claudication, dizziness, presyncope, syncope, bleeding, or neurologic sequela. The patient is tolerating medications without difficulties and is otherwise without complaint today.    Past Medical History:  Diagnosis Date  . History of tobacco abuse    Quit 1976  . HLD (hyperlipidemia)   . Low testosterone in male   . Nocturia   . OA (osteoarthritis)    Past Surgical History:  Procedure Laterality Date  . HERNIA REPAIR  1972     Current Outpatient Prescriptions  Medication Sig Dispense Refill  . aspirin 81 MG tablet Take 81 mg by mouth daily.    . meloxicam (MOBIC) 15 MG tablet Take 1 tablet by mouth  daily 90 tablet 1  . nortriptyline (PAMELOR) 25 MG capsule Take 1 capsule (25 mg total) by mouth daily. 30 capsule 3  . omeprazole (PRILOSEC) 40 MG capsule Take 1  capsule (40 mg total) by mouth daily. 30 capsule 3  . pravastatin (PRAVACHOL) 80 MG tablet TAKE 1 TABLET BY MOUTH  DAILY 90 tablet 2  . tamsulosin (FLOMAX) 0.4 MG CAPS capsule Take 1 capsule (0.4 mg total) by mouth daily. 30 capsule 3   No current facility-administered medications for this visit.     Allergies:   Latex and Testosterone   Social History:  The patient  reports that he has quit smoking. He has never used smokeless tobacco. He reports that he does not drink alcohol or use drugs.   Family History:  The patient's family history includes Cancer in his mother; Depression in his father; Diabetes in his father; Heart failure in his father; Hypertension in his father, mother, and sister.    ROS:  Please see the history of present illness.   Otherwise, review of systems is positive for difficulty urinating.   All other systems are reviewed and negative.    PHYSICAL EXAM: VS:  BP 124/64   Pulse 81   Ht 5\' 9"  (1.753 m)   Wt 204 lb 6.4 oz (92.7 kg)   SpO2 98%   BMI 30.18 kg/m  , BMI Body mass index is 30.18 kg/m. GEN: Well nourished, well developed, in no acute distress  HEENT: normal  Neck: no JVD, carotid bruits, or masses Cardiac: RRR; 1/6 systolic murmurs at  the apex, rubs, or gallops,no edema  Respiratory:  clear to auscultation bilaterally, normal work of breathing GI: soft, nontender, nondistended, + BS MS: no deformity or atrophy  Skin: warm and dry Neuro:  Strength and sensation are intact Psych: euthymic mood, full affect  EKG:  EKG is not ordered today. Personal review of the ekg ordered 05/22/16 shows sinus rhythm, rate 58  Recent Labs: No results found for requested labs within last 8760 hours.    Lipid Panel     Component Value Date/Time   CHOL 222 (H) 12/26/2015 1514   TRIG 101 12/26/2015 1514   HDL 59 12/26/2015 1514   CHOLHDL 3.8 12/26/2015 1514   VLDL 20 12/26/2015 1514   LDLCALC 143 (H) 12/26/2015 1514     Wt Readings from Last 3 Encounters:   05/29/16 204 lb 6.4 oz (92.7 kg)  05/22/16 203 lb 3.2 oz (92.2 kg)  03/05/16 200 lb 6.4 oz (90.9 kg)      Other studies Reviewed: Additional studies/ records that were reviewed today include: PCP notes   ASSESSMENT AND PLAN:  1.  Chest pain:Currently, it does not appear that his chest pain is due to cardiac causes. He says that his chest pain is worsened when he does not take his Zantac. As it is in the center of his chest, it is not associated with shortness of breath or diaphoresis. It is also not a reproducible event, and he can get his heart rate of 150 with exercise and does not have chest pain. At this time I Dominyk Law defer further testing. I told him that if he does have pain changes in character or occurs with exertion and improves with rest that we would be more than happy to see him again.  2. Hyperlipidemia: continue statin  3. Systolic murmur: Found on exam today. Likely is a flow murmur. Continue monitored by his primary physician.  Current medicines are reviewed at length with the patient today.   The patient does not have concerns regarding his medicines.  The following changes were made today:  none  Labs/ tests ordered today include:  No orders of the defined types were placed in this encounter.    Disposition:   FU with Daune Divirgilio PRN  Signed, Daliya Parchment Meredith Leeds, MD  05/29/2016 8:45 AM     [facility] [address] [place] [address] [phone] (office) [phone] (fax)

## 2016-05-29 NOTE — Patient Instructions (Signed)
Medication Instructions:  Your physician recommends that you continue on your current medications as directed. Please refer to the Current Medication list given to you today.  * If you need a refill on your cardiac medications before your next appointment, please call your pharmacy.   Labwork: None ordered  Testing/Procedures: None ordered  Follow-Up: No follow up is needed at this time with Dr. Camnitz.  He will see you on an as needed basis.   Thank you for choosing [facility]!!   Cassidee Deats, RN [phone]     

## 2016-06-03 ENCOUNTER — Telehealth: Payer: Self-pay | Admitting: Family Medicine

## 2016-06-03 DIAGNOSIS — Z1211 Encounter for screening for malignant neoplasm of colon: Secondary | ICD-10-CM

## 2016-06-03 NOTE — Telephone Encounter (Signed)
Can you please place referral?  

## 2016-06-03 NOTE — Telephone Encounter (Signed)
Patient stated his availability for Colonoscopy was April 23rd-27th due to being off work. Advised referral would be made.

## 2016-06-18 ENCOUNTER — Telehealth: Payer: Self-pay | Admitting: Family Medicine

## 2016-06-18 DIAGNOSIS — Z1211 Encounter for screening for malignant neoplasm of colon: Secondary | ICD-10-CM

## 2016-06-18 NOTE — Telephone Encounter (Signed)
Patient is calling for referral to GI for colonoscopy.  He thought it was in already and was going to be contacted but I don't see a referral for GI in his account.  Thank you,  -LL

## 2016-06-18 NOTE — Telephone Encounter (Signed)
Patient would like referral for colonoscopy

## 2016-06-19 ENCOUNTER — Encounter: Payer: Self-pay | Admitting: Physician Assistant

## 2016-06-19 NOTE — Telephone Encounter (Signed)
Spoke with patient.  He is scheduled for GI consult on 06/27/2016.

## 2016-06-19 NOTE — Telephone Encounter (Signed)
I apologize - thought that I added it already as well. I originally wanted to wait until Cardiology evaluation to see if heart was causing chest pain during exercise. Sounds like all GERD. Ask patient if those symptoms are the same. Need to consider EGD in that case. Would refer to GI for evaluation first - they could do both scopes at the same time, but would want to talk with him first.

## 2016-06-27 ENCOUNTER — Encounter (INDEPENDENT_AMBULATORY_CARE_PROVIDER_SITE_OTHER): Payer: Self-pay

## 2016-06-27 ENCOUNTER — Ambulatory Visit (INDEPENDENT_AMBULATORY_CARE_PROVIDER_SITE_OTHER): Payer: 59 | Admitting: Physician Assistant

## 2016-06-27 ENCOUNTER — Encounter: Payer: Self-pay | Admitting: Physician Assistant

## 2016-06-27 VITALS — BP 122/80 | HR 71 | Ht 69.0 in | Wt 204.0 lb

## 2016-06-27 DIAGNOSIS — Z1211 Encounter for screening for malignant neoplasm of colon: Secondary | ICD-10-CM

## 2016-06-27 DIAGNOSIS — K219 Gastro-esophageal reflux disease without esophagitis: Secondary | ICD-10-CM

## 2016-06-27 MED ORDER — NA SULFATE-K SULFATE-MG SULF 17.5-3.13-1.6 GM/177ML PO SOLN: 1.0000 | ORAL | 0 refills | Status: DC

## 2016-06-27 NOTE — Progress Notes (Signed)
Chief Complaint: Consult for a screening colonoscopy, GERD  HPI:  Cesar Green is a 66 year old Caucasian male with a history of hyperlipidemia and others listed below, who was referred to me by Briscoe Deutscher, DO for consult of a screening colonoscopy as well as reflux .     Today, the patient tells me that his last colonoscopy was about 10 years ago in [place]. He does not remember exactly what was found but believes he was told to repeat in 10 years. He is due now. He denies any change in bowel habits, weight loss or rectal bleeding.   The patient also tells me today that for about 3 years he had uncontrolled reflux symptoms and was only recently started on Omeprazole 40 mg daily in mid February. Since starting this medication he has had no further problems.   Patient's only real concern today is that he has had a 10 pound weight gain over the past year and a half or so. He tells me he works out on a daily basis including cardiovascular weight lifting and does observe a very healthy diet. He does work third shift at his job and sometimes "snacks", but these are healthy including pistachios, etc. Patient just wonders if this is a part of "getting older".   Patient denies fever, chills, blood in his stool, melena, weight loss, fatigue, nausea, vomiting, abdominal pain or symptoms that awaken him at night.  Past Medical History:  Diagnosis Date  . History of tobacco abuse    Quit 1976  . HLD (hyperlipidemia)   . Low testosterone in male   . Nocturia   . OA (osteoarthritis)     Past Surgical History:  Procedure Laterality Date  . HERNIA REPAIR  1972    Current Outpatient Prescriptions  Medication Sig Dispense Refill  . aspirin 81 MG tablet Take 81 mg by mouth daily.    . DHA-EPA-Vitamin E (OMEGA-3 COMPLEX PO) Take by mouth.    . meloxicam (MOBIC) 15 MG tablet Take 1 tablet by mouth  daily 90 tablet 1  . omeprazole (PRILOSEC) 40 MG capsule Take 1 capsule (40 mg total) by  mouth daily. 30 capsule 3  . pravastatin (PRAVACHOL) 80 MG tablet TAKE 1 TABLET BY MOUTH  DAILY 90 tablet 2  . tamsulosin (FLOMAX) 0.4 MG CAPS capsule Take 1 capsule (0.4 mg total) by mouth daily. 30 capsule 3   No current facility-administered medications for this visit.     Allergies as of 06/27/2016 - Review Complete 06/27/2016  Allergen Reaction Noted  . Latex Rash 05/22/2016  . Testosterone Hives 05/22/2016    Family History  Problem Relation Age of Onset  . Cancer Mother   . Hypertension Mother   . Heart failure Father   . Diabetes Father   . Depression Father   . Hypertension Father   . Hypertension Sister     Social History   Social History  . Marital status: Married    Spouse name: N/A  . Number of children: 2  . Years of education: N/A   Occupational History  .  [employer]   Social History Main Topics  . Smoking status: Former Research scientist (life sciences)  . Smokeless tobacco: Never Used     Comment: Quit 1976  . Alcohol use No  . Drug use: No  . Sexual activity: Yes   Other Topics Concern  . Not on file   Social History Narrative   5-6 cups of coffee per day. Works second shift.  HIIT several days per week.    Review of Systems:    Constitutional: No weight loss, fever or chills HEENT: Eyes: No change in vision               Ears, Nose, Throat:  Positive for "hearing problems" Cardiovascular: No chest pain Respiratory: No SOB  Gastrointestinal: See HPI and otherwise negative Genitourinary: Positive for increase in urinary frequency Neurological: No headache, dizziness or syncope Musculoskeletal: Positive for arthritis Hematologic: No bleeding or bruising Psychiatric: Positive for sleeping problems   Physical Exam:  Vital signs: BP 122/80   Pulse 71   Ht 5\' 9"  (1.753 m)   Wt 204 lb (92.5 kg)   SpO2 98%   BMI 30.13 kg/m   Constitutional:   Pleasant Caucasian male appears to be in NAD, Well developed, Well nourished, alert and cooperative Head:  Normocephalic  and atraumatic. Eyes:   PEERL, EOMI. No icterus. Conjunctiva pink. Ears:  Normal auditory acuity. Neck:  Supple Throat: Oral cavity and pharynx without inflammation, swelling or lesion.  Respiratory: Respirations even and unlabored. Lungs clear to auscultation bilaterally.   No wheezes, crackles, or rhonchi.  Cardiovascular: Normal S1, S2. No MRG. Regular rate and rhythm. No peripheral edema, cyanosis or pallor.  Gastrointestinal:  Soft, nondistended, nontender. No rebound or guarding. Normal bowel sounds. No appreciable masses or hepatomegaly. Rectal:  Not performed.  Msk:  Symmetrical without gross deformities. Without edema, no deformity or joint abnormality.  Neurologic:  Alert and  oriented x4;  grossly normal neurologically.  Skin:   Dry and intact without significant lesions or rashes. Psychiatric:  Demonstrates good judgement and reason without abnormal affect or behaviors.  No recent labs or imaging.  Assessment: 1. Screening colonoscopy: Patient's last colonoscopy was 10 years ago, repeat due now 2. GERD: Uncontrolled for 3 years, controlled now over the past month and a half on omeprazole 40 mg daily, concern for damage caused during that time  Plan: 1. Scheduled patient for an EGD and colonoscopy. Discussed risks, benefits, limitations and alternatives. Patient was scheduled with Dr. Ardis Hughs in the [facility], as he is the supervising physician this afternoon 2. Recommend he continue his omeprazole 40 mg daily, 30-60 minutes with breakfast. 3. Discussed weight gain, this is only a small amount and likely due to decrease in metabolism or his body becoming accustomed to his same daily workout regimen, discussed that he could try switching up his cardio and counting calories/altering his diet if this truly concerns him 4.  Patient upon clinical recommendations from Dr. Ardis Hughs after time procedures.  Ellouise Newer, PA-C [facility] 06/27/2016, 1:44 PM  Cc: Briscoe Deutscher,  DO

## 2016-06-27 NOTE — Progress Notes (Signed)
I agree with the above note, plan 

## 2016-06-27 NOTE — Patient Instructions (Addendum)
You have been scheduled for an endoscopy and colonoscopy. Please follow the written instructions given to you at your visit today. Please pick up your prep supplies at the pharmacy within the next 1-3 days. If you use inhalers (even only as needed), please bring them with you on the day of your procedure. Your physician has requested that you go to www.startemmi.com and enter the access code given to you at your visit today. This web site gives a general overview about your procedure. However, you should still follow specific instructions given to you by our office regarding your preparation for the procedure.  Continue Omeprazole 40 mg twice a day.

## 2016-08-01 ENCOUNTER — Ambulatory Visit (INDEPENDENT_AMBULATORY_CARE_PROVIDER_SITE_OTHER): Payer: 59

## 2016-08-01 ENCOUNTER — Ambulatory Visit (INDEPENDENT_AMBULATORY_CARE_PROVIDER_SITE_OTHER): Payer: 59 | Admitting: Family Medicine

## 2016-08-01 ENCOUNTER — Telehealth: Payer: Self-pay | Admitting: Family Medicine

## 2016-08-01 ENCOUNTER — Encounter: Payer: Self-pay | Admitting: Family Medicine

## 2016-08-01 VITALS — HR 61 | Temp 97.9°F | Ht 69.0 in | Wt 206.2 lb

## 2016-08-01 DIAGNOSIS — M25532 Pain in left wrist: Secondary | ICD-10-CM

## 2016-08-01 NOTE — Telephone Encounter (Signed)
Patient called saying he got his hand caught in a wheel chair lift. Has some swelling and discomfort. Scheduled him for today 08/01/16 at 10:45 a.m.

## 2016-08-01 NOTE — Telephone Encounter (Signed)
Noted  

## 2016-08-01 NOTE — Progress Notes (Signed)
Cesar Green is a 66 y.o. male here for a new problem.  History of Present Illness:   [name] CMA acting as scribe for Dr. Juleen China.  Chief Complaint  Patient presents with  . Wrist Pain    Got left hand/wrist caught between wheel chair lift and van.   Wrist Pain   The pain is present in the left wrist. This is a new problem. The current episode started yesterday. There has been no history of extremity trauma. The problem occurs intermittently. The problem has been unchanged. The quality of the pain is described as aching. The pain is at a severity of 1/10. Associated symptoms include tingling. Pertinent negatives include no fever, itching or numbness. The symptoms are aggravated by activity. He has tried cold and NSAIDS for the symptoms. The treatment provided no relief.   PMHx, SurgHx, SocialHx, Medications, and Allergies were reviewed in the Visit Navigator and updated as appropriate.  Current Medications:   Current Outpatient Prescriptions:  .  aspirin 81 MG tablet, Take 81 mg by mouth daily., Disp: , Rfl:  .  DHA-EPA-Vitamin E (OMEGA-3 COMPLEX PO), Take by mouth., Disp: , Rfl:  .  meloxicam (MOBIC) 15 MG tablet, Take 1 tablet by mouth  daily, Disp: 90 tablet, Rfl: 1 .  Na Sulfate-K Sulfate-Mg Sulf 17.5-3.13-1.6 GM/180ML SOLN, Take 1 kit by mouth as directed., Disp: 354 mL, Rfl: 0 .  omeprazole (PRILOSEC) 40 MG capsule, Take 1 capsule (40 mg total) by mouth daily., Disp: 30 capsule, Rfl: 3 .  pravastatin (PRAVACHOL) 80 MG tablet, TAKE 1 TABLET BY MOUTH  DAILY, Disp: 90 tablet, Rfl: 2 .  tamsulosin (FLOMAX) 0.4 MG CAPS capsule, Take 1 capsule (0.4 mg total) by mouth daily., Disp: 30 capsule, Rfl: 3   Review of Systems:   Review of Systems  Constitutional: Negative for chills, fever and malaise/fatigue.  HENT: Negative for congestion, ear pain, sinus pain and sore throat.   Eyes: Negative for blurred vision and double vision.  Respiratory: Negative for cough, shortness of  breath and wheezing.   Cardiovascular: Negative for chest pain, palpitations and leg swelling.  Gastrointestinal: Negative for abdominal pain, constipation, diarrhea and vomiting.  Genitourinary: Negative for dysuria.  Musculoskeletal: Negative for back pain, joint pain and neck pain.  Skin: Negative for itching and rash.  Neurological: Positive for tingling. Negative for dizziness, numbness and headaches.  Psychiatric/Behavioral: Negative for depression, hallucinations and memory loss.    Vitals:   Vitals:   08/01/16 1039  Pulse: 61  Temp: 97.9 F (36.6 C)  TempSrc: Oral  SpO2: 96%  Weight: 206 lb 3.2 oz (93.5 kg)  Height: 5' 9"  (1.753 m)     Body mass index is 30.45 kg/m.  Physical Exam:   Physical Exam  Constitutional: He is oriented to person, place, and time. He appears well-developed and well-nourished. No distress.  HENT:  Head: Normocephalic and atraumatic.  Right Ear: External ear normal.  Left Ear: External ear normal.  Nose: Nose normal.  Mouth/Throat: Oropharynx is clear and moist.  Eyes: Conjunctivae and EOM are normal. Pupils are equal, round, and reactive to light.  Neck: Normal range of motion. Neck supple.  Cardiovascular: Normal rate, regular rhythm, normal heart sounds and intact distal pulses.   Pulmonary/Chest: Effort normal and breath sounds normal.  Abdominal: Soft. Bowel sounds are normal.  Musculoskeletal:       Left wrist: He exhibits decreased range of motion, tenderness and swelling. He exhibits no deformity.  Neurological: He is  alert and oriented to person, place, and time.  Skin: Skin is warm and dry.  Psychiatric: He has a normal mood and affect. His behavior is normal. Judgment and thought content normal.  Nursing note and vitals reviewed.  EXAM: LEFT WRIST - COMPLETE 3+ VIEW  FINDINGS: Healed fracture with deformity of the left fifth metacarpal distally. Normal alignment without acute osseous finding or fracture. Distal radius,  ulna, and carpal bones appear intact. Soft tissue edema noted about the wrist.  IMPRESSION: No acute osseous finding.  Diffuse soft tissue swelling  Healed fracture left fifth metacarpal distally.  Assessment and Plan:    Roberta was seen today for wrist pain.  Diagnoses and all orders for this visit:  Left wrist pain Comments: No fracture. Sprain, with significant swelling, with healing abrasion to anterior wrist. Good capillary refill. Splint provided. I recommend follow up with Dr. Paulla Fore in 1-2 weeks due to impressive exam today to ensure continued healing. RED FLAGS reviewed. Orders: -     DG Wrist Complete Left; Future   . Reviewed expectations re: course of current medical issues. . Discussed self-management of symptoms. . Outlined signs and symptoms indicating need for more acute intervention. . Patient verbalized understanding and all questions were answered. . See orders for this visit as documented in the electronic medical record. . Patient received an After-Visit Summary.  CMA served as Education administrator during this visit. History, Physical, and Plan performed by medical provider. Documentation and orders reviewed and attested to. Briscoe Deutscher, D.O.  Briscoe Deutscher, D.O. [place], [facility]

## 2016-08-11 ENCOUNTER — Ambulatory Visit (INDEPENDENT_AMBULATORY_CARE_PROVIDER_SITE_OTHER): Payer: 59 | Admitting: Sports Medicine

## 2016-08-11 ENCOUNTER — Telehealth: Payer: Self-pay | Admitting: Family Medicine

## 2016-08-11 ENCOUNTER — Other Ambulatory Visit: Payer: Self-pay

## 2016-08-11 ENCOUNTER — Encounter: Payer: Self-pay | Admitting: Sports Medicine

## 2016-08-11 VITALS — BP 118/80 | HR 60 | Ht 69.0 in | Wt 199.0 lb

## 2016-08-11 DIAGNOSIS — R238 Other skin changes: Secondary | ICD-10-CM

## 2016-08-11 DIAGNOSIS — R1013 Epigastric pain: Secondary | ICD-10-CM

## 2016-08-11 DIAGNOSIS — M25532 Pain in left wrist: Secondary | ICD-10-CM

## 2016-08-11 DIAGNOSIS — R351 Nocturia: Secondary | ICD-10-CM

## 2016-08-11 DIAGNOSIS — S6732XA Crushing injury of left wrist, initial encounter: Secondary | ICD-10-CM

## 2016-08-11 MED ORDER — TAMSULOSIN HCL 0.4 MG PO CAPS: 0.4000 mg | ORAL_CAPSULE | Freq: Every day | ORAL | 2 refills | Status: DC

## 2016-08-11 MED ORDER — OMEPRAZOLE 40 MG PO CPDR: 40.0000 mg | DELAYED_RELEASE_CAPSULE | Freq: Every day | ORAL | 2 refills | Status: DC

## 2016-08-11 NOTE — Telephone Encounter (Signed)
**  Remind patient they can make refill requests via MyChart**  Medication refill request (Name & Dosage): omeprazole (PRILOSEC) 40 MG capsule [[id number]]  tamsulosin (FLOMAX) 0.4 MG CAPS capsule [[id number]]    Preferred pharmacy (Name & Address):  [facility], [place] [phone] (Phone) [phone] (Fax)      Other comments (if applicable):    Would like to be set up as 90 day prescription.

## 2016-08-11 NOTE — Progress Notes (Signed)
OFFICE VISIT NOTE [facility]. [place]  Edmar Blankenburg - 66 y.o. male MRN [id number]  Date of birth: Apr 21, 1950  Visit Date: 08/11/2016  PCP: Briscoe Deutscher, DO   Referred by: Briscoe Deutscher, DO  Autumn McNeil, cma acting as scribe for Dr. Paulla Fore.  SUBJECTIVE:   Chief Complaint  Patient presents with  . NP: Left Wrist Pain   HPI: As below and per problem based documentation when appropriate.  Sahmir had a recent xray which revealed No fracture, with significant swelling, with healing abrasion to anterior wrist. He was fixing his daughters wheel chair lift when he smashed his hand. Splint provided and only wears at work. He is icing the area with some relief. No longer taking Ibuprofen. There is minimal tightness in wrist when flexing.     Review of Systems  Constitutional: Negative.   HENT: Negative.   Eyes: Negative.   Respiratory: Negative.   Cardiovascular: Negative.   Gastrointestinal: Negative.   Genitourinary: Negative.   Musculoskeletal: Negative.   Skin: Negative.   Neurological: Negative.   Endo/Heme/Allergies: Negative.   Psychiatric/Behavioral: Negative.     Otherwise per HPI.  HISTORY & PERTINENT PRIOR DATA:  No specialty comments available. He reports that he has quit smoking. He has never used smokeless tobacco. No results for input(s): HGBA1C, LABURIC in the last 8760 hours. Medications & Allergies reviewed per EMR Patient Active Problem List   Diagnosis Date Noted  . Crushing injury of left wrist 08/11/2016  . Hypotestosteronemia 12/26/2015  . Urinary hesitancy 12/15/2014  . Insomnia 12/23/2013  . Hyperlipidemia 06/01/2013  . Right knee pain 06/01/2013   Past Medical History:  Diagnosis Date  . History of tobacco abuse    Quit 1976  . HLD (hyperlipidemia)   . Low testosterone in male   . Nocturia   . OA (osteoarthritis)    Family History  Problem Relation Age of  Onset  . Cancer Mother   . Hypertension Mother   . Heart failure Father   . Diabetes Father   . Depression Father   . Hypertension Father   . Hypertension Sister    Past Surgical History:  Procedure Laterality Date  . HERNIA REPAIR  1972   Social History   Occupational History  .  [employer]   Social History Main Topics  . Smoking status: Former Research scientist (life sciences)  . Smokeless tobacco: Never Used     Comment: Quit 1976  . Alcohol use No  . Drug use: No  . Sexual activity: Yes    OBJECTIVE:  VS:  HT:5\' 9"  (175.3 cm)   WT:199 lb (90.3 kg)  BMI:29.4    BP:118/80  HR:60bpm  TEMP: ( )  RESP:98 % EXAM: WDWN, NAD, Non-toxic appearing Alert & appropriately interactive Not depressed or anxious appearing No increased work of breathing. Pupils are equal. EOM intact without nystagmus No clubbing or cyanosis of the extremities appreciated No significant rashes/lesions/ulcerations overlying the examined area. Radial pulses 2+/4.  No significant generalized UE edema. Sensation intact to light touch in upper extremities.  LEFT Wrist/Hand:  Overall wrist  is well aligned, no significant deformity or atrophy.  There is a small superficial abrasion along the dorsal aspect of his wrist and the palmar aspect of the DRUJ does have a linear superficial laceration that is healing well and only a small amount of surrounding erythema.  No significant effusion or swelling.    Grip strength intact.  Wrist extension strength is normal.  Wrist flexion strength normal.  No significant TTP over the anatomic snuff box, distal radius & ulna, or proximal & distal carpal rows  No pain with carpal tunnel compression test, negative Tinel's  No additional findings.     Dg Wrist Complete Left  Result Date: 08/01/2016 CLINICAL DATA:  Injury, anterior wrist pain EXAM: LEFT WRIST - COMPLETE 3+ VIEW COMPARISON:  None available FINDINGS: Healed fracture with deformity of the left fifth metacarpal distally. Normal  alignment without acute osseous finding or fracture. Distal radius, ulna, and carpal bones appear intact. Soft tissue edema noted about the wrist. IMPRESSION: No acute osseous finding. Diffuse soft tissue swelling Healed fracture left fifth metacarpal distally Electronically Signed   By: Jerilynn Mages.  Shick M.D.   On: 08/01/2016 13:24   ASSESSMENT & PLAN:  Visit Diagnoses:  1. Left wrist pain   2. Crushing injury of left wrist, initial encounter   3. Skin irritation    Problem List Items Addressed This Visit    Crushing injury of left wrist    Remarkably I believe he is come out of this with only minimal injury. Fully functional today.  Only a small superficial abrasion and small area of induration likely reflective of small hematoma on the dorsal aspect of the wrist.  Recommend compression sleeve which she already has at home, discontinuation of Neosporin on the wound and transition to Vaseline only as I am concerned that he may have a small hypersensitivity reaction to this.  Otherwise I will plan to follow-up with him only if he is having persistent symptoms.  Will need to be cautious for the development of potential carpal tunnel syndrome given the proximity of the injury and the initial numbness/coldness that he experienced for the first 30 minutes.  Overall though fully functional and no deficits at this time.  Red flag symptoms reviewed.       Other Visit Diagnoses    Left wrist pain    -  Primary   Skin irritation       Likely reflective of neomycin allergy     Meds: No orders of the defined types were placed in this encounter.   Orders: No orders of the defined types were placed in this encounter.   Follow-up: Return if symptoms worsen or fail to improve.   CMA/ATC served as Education administrator during this visit. History, Physical, and Plan performed by medical provider. Documentation and orders reviewed and attested to.      Teresa Coombs, [facility] Physician    08/11/2016 5:33  PM

## 2016-08-11 NOTE — Telephone Encounter (Signed)
Patient called to confirm that the medication was sent to the [facility] and not the pharmacy. Please call patient and advise.

## 2016-08-11 NOTE — Telephone Encounter (Signed)
Rx has been sent to [facility]. 

## 2016-08-11 NOTE — Assessment & Plan Note (Signed)
Remarkably I believe he is come out of this with only minimal injury. Fully functional today.  Only a small superficial abrasion and small area of induration likely reflective of small hematoma on the dorsal aspect of the wrist.  Recommend compression sleeve which she already has at home, discontinuation of Neosporin on the wound and transition to Vaseline only as I am concerned that he may have a small hypersensitivity reaction to this.  Otherwise I will plan to follow-up with him only if he is having persistent symptoms.  Will need to be cautious for the development of potential carpal tunnel syndrome given the proximity of the injury and the initial numbness/coldness that he experienced for the first 30 minutes.  Overall though fully functional and no deficits at this time.  Red flag symptoms reviewed.

## 2016-08-12 NOTE — Telephone Encounter (Signed)
Patient notified

## 2016-08-15 ENCOUNTER — Encounter: Payer: Self-pay | Admitting: Gastroenterology

## 2016-08-27 ENCOUNTER — Encounter: Payer: 59 | Admitting: Gastroenterology

## 2016-08-29 ENCOUNTER — Encounter: Payer: Self-pay | Admitting: Gastroenterology

## 2016-08-29 ENCOUNTER — Ambulatory Visit (AMBULATORY_SURGERY_CENTER): Payer: 59 | Admitting: Gastroenterology

## 2016-08-29 VITALS — BP 106/61 | HR 64 | Temp 98.4°F | Resp 17 | Ht 69.0 in | Wt 199.0 lb

## 2016-08-29 DIAGNOSIS — Z1212 Encounter for screening for malignant neoplasm of rectum: Secondary | ICD-10-CM | POA: Diagnosis not present

## 2016-08-29 DIAGNOSIS — D122 Benign neoplasm of ascending colon: Secondary | ICD-10-CM | POA: Diagnosis not present

## 2016-08-29 DIAGNOSIS — D120 Benign neoplasm of cecum: Secondary | ICD-10-CM

## 2016-08-29 DIAGNOSIS — K5730 Diverticulosis of large intestine without perforation or abscess without bleeding: Secondary | ICD-10-CM | POA: Diagnosis not present

## 2016-08-29 DIAGNOSIS — Z1211 Encounter for screening for malignant neoplasm of colon: Secondary | ICD-10-CM | POA: Diagnosis not present

## 2016-08-29 DIAGNOSIS — K219 Gastro-esophageal reflux disease without esophagitis: Secondary | ICD-10-CM

## 2016-08-29 MED ORDER — SODIUM CHLORIDE 0.9 % IV SOLN: 500.0000 mL | INTRAVENOUS | Status: DC

## 2016-08-29 NOTE — Progress Notes (Signed)
A and O x3. Report to RN. Tolerated MAC anesthesia well.Teeth unchanged after procedure.

## 2016-08-29 NOTE — Op Note (Signed)
[facility] Patient Name: Mckoy Bhakta Procedure Date: 08/29/2016 1:49 PM MRN: [id number] Endoscopist: Milus Banister , MD Age: 66 Referring MD:  Date of Birth: [birth year] Gender: Male Account #: [id number] Procedure:                Upper GI endoscopy Indications:              Heartburn Medicines:                Monitored Anesthesia Care Procedure:                Pre-Anesthesia Assessment:                           - Prior to the procedure, a History and Physical                            was performed, and patient medications and                            allergies were reviewed. The patient's tolerance of                            previous anesthesia was also reviewed. The risks                            and benefits of the procedure and the sedation                            options and risks were discussed with the patient.                            All questions were answered, and informed consent                            was obtained. Prior Anticoagulants: The patient has                            taken no previous anticoagulant or antiplatelet                            agents. ASA Grade Assessment: II - A patient with                            mild systemic disease. After reviewing the risks                            and benefits, the patient was deemed in                            satisfactory condition to undergo the procedure.                           After obtaining informed consent, the endoscope was  passed under direct vision. Throughout the                            procedure, the patient's blood pressure, pulse, and                            oxygen saturations were monitored continuously. The                            Endoscope was introduced through the mouth, and                            advanced to the second part of duodenum. The upper                            GI endoscopy was accomplished without difficulty.                             The patient tolerated the procedure well. Scope In: Scope Out: Findings:                 The esophagus was normal.                           The stomach was normal.                           The examined duodenum was normal. Complications:            No immediate complications. Estimated blood loss:                            None. Estimated Blood Loss:     Estimated blood loss: none. Impression:               - Normal esophagus.                           - Normal stomach.                           - Normal examined duodenum.                           - No specimens collected. Recommendation:           - Patient has a contact number available for                            emergencies. The signs and symptoms of potential                            delayed complications were discussed with the                            patient. Return to normal activities tomorrow.  Written discharge instructions were provided to the                            patient.                           - Resume previous diet.                           - Continue present medications, once daily                            omeprazole seems to be really helping. Milus Banister, MD 08/29/2016 2:15:51 PM This report has been signed electronically.

## 2016-08-29 NOTE — Op Note (Signed)
[facility] Patient Name: Cesar Green Procedure Date: 08/29/2016 1:50 PM MRN: [id number] Endoscopist: Milus Banister , MD Age: 66 Referring MD:  Date of Birth: 06-Jan-1951 Gender: Male Account #: [id number] Procedure:                Colonoscopy Indications:              Screening for colorectal malignant neoplasm Medicines:                Monitored Anesthesia Care Procedure:                Pre-Anesthesia Assessment:                           - Prior to the procedure, a History and Physical                            was performed, and patient medications and                            allergies were reviewed. The patient's tolerance of                            previous anesthesia was also reviewed. The risks                            and benefits of the procedure and the sedation                            options and risks were discussed with the patient.                            All questions were answered, and informed consent                            was obtained. Prior Anticoagulants: The patient has                            taken no previous anticoagulant or antiplatelet                            agents. ASA Grade Assessment: II - A patient with                            mild systemic disease. After reviewing the risks                            and benefits, the patient was deemed in                            satisfactory condition to undergo the procedure.                           After obtaining informed consent, the colonoscope  was passed under direct vision. Throughout the                            procedure, the patient's blood pressure, pulse, and                            oxygen saturations were monitored continuously. The                            Model CF-HQ190L [id number]) scope was introduced                            through the anus and advanced to the the cecum,                            identified by  appendiceal orifice and ileocecal                            valve. The colonoscopy was performed without                            difficulty. The patient tolerated the procedure                            well. The quality of the bowel preparation was                            good. The ileocecal valve, appendiceal orifice, and                            rectum were photographed. Scope In: 1:57:49 PM Scope Out: 2:07:39 PM Scope Withdrawal Time: 0 hours 7 minutes 45 seconds  Total Procedure Duration: 0 hours 9 minutes 50 seconds  Findings:                 Three sessile polyps were found in the ascending                            colon and cecum. The polyps were 2 to 3 mm in size.                            These polyps were removed with a cold snare.                            Resection and retrieval were complete.                           Multiple small and large-mouthed diverticula were                            found in the left colon.                           The exam was otherwise without abnormality on  direct and retroflexion views. Complications:            No immediate complications. Estimated blood loss:                            None. Estimated Blood Loss:     Estimated blood loss: none. Impression:               - Three 2 to 3 mm polyps in the ascending colon and                            in the cecum, removed with a cold snare. Resected                            and retrieved.                           - Diverticulosis in the left colon.                           - The examination was otherwise normal on direct                            and retroflexion views. Recommendation:           - Patient has a contact number available for                            emergencies. The signs and symptoms of potential                            delayed complications were discussed with the                            patient. Return to normal  activities tomorrow.                            Written discharge instructions were provided to the                            patient.                           - Resume previous diet.                           - Continue present medications.                           You will receive a letter within 2-3 weeks with the                            pathology results and my final recommendations.                           If the polyp(s) is proven to be 'pre-cancerous' on  pathology, you will need repeat colonoscopy in 3-5                            years. If the polyp(s) is NOT 'precancerous' on                            pathology then you should repeat colon cancer                            screening in 10 years with colonoscopy without need                            for colon cancer screening by any method prior to                            then (including stool testing). Milus Banister, MD 08/29/2016 2:09:58 PM This report has been signed electronically.

## 2016-08-29 NOTE — Progress Notes (Signed)
Called to room to assist during endoscopic procedure.  Patient ID and intended procedure confirmed with present staff. Received instructions for my participation in the procedure from the performing physician.  

## 2016-08-29 NOTE — Patient Instructions (Signed)
YOU HAD AN ENDOSCOPIC PROCEDURE TODAY AT [facility]:   Refer to the procedure report that was given to you for any specific questions about what was found during the examination.  If the procedure report does not answer your questions, please call your gastroenterologist to clarify.  If you requested that your care partner not be given the details of your procedure findings, then the procedure report has been included in a sealed envelope for you to review at your convenience later.  YOU SHOULD EXPECT: Some feelings of bloating in the abdomen. Passage of more gas than usual.  Walking can help get rid of the air that was put into your GI tract during the procedure and reduce the bloating. If you had a lower endoscopy (such as a colonoscopy or flexible sigmoidoscopy) you may notice spotting of blood in your stool or on the toilet paper. If you underwent a bowel prep for your procedure, you may not have a normal bowel movement for a few days.  Please Note:  You might notice some irritation and congestion in your nose or some drainage.  This is from the oxygen used during your procedure.  There is no need for concern and it should clear up in a day or so.  SYMPTOMS TO REPORT IMMEDIATELY:   Following lower endoscopy (colonoscopy or flexible sigmoidoscopy):  Excessive amounts of blood in the stool  Significant tenderness or worsening of abdominal pains  Swelling of the abdomen that is new, acute  Fever of 100F or higher   Following upper endoscopy (EGD)  Vomiting of blood or coffee ground material  New chest pain or pain under the shoulder blades  Painful or persistently difficult swallowing  New shortness of breath  Fever of 100F or higher  Black, tarry-looking stools  For urgent or emergent issues, a gastroenterologist can be reached at any hour by calling [phone].   DIET:  We do recommend a small meal at first, but then you may proceed to your regular diet.  Drink  plenty of fluids but you should avoid alcoholic beverages for 24 hours.  ACTIVITY:  You should plan to take it easy for the rest of today and you should NOT DRIVE or use heavy machinery until tomorrow (because of the sedation medicines used during the test).    FOLLOW UP: Our staff will call the number listed on your records the next business day following your procedure to check on you and address any questions or concerns that you may have regarding the information given to you following your procedure. If we do not reach you, we will leave a message.  However, if you are feeling well and you are not experiencing any problems, there is no need to return our call.  We will assume that you have returned to your regular daily activities without incident.  If any biopsies were taken you will be contacted by phone or by letter within the next 1-3 weeks.  Please call us at [phone] if you have not heard about the biopsies in 3 weeks.    SIGNATURES/CONFIDENTIALITY: You and/or your care partner have signed paperwork which will be entered into your electronic medical record.  These signatures attest to the fact that that the information above on your After Visit Summary has been reviewed and is understood.  Full responsibility of the confidentiality of this discharge information lies with you and/or your care-partner.   Diverticulosis, high fiber, and polyp information given.

## 2016-09-02 ENCOUNTER — Telehealth: Payer: Self-pay

## 2016-09-02 NOTE — Telephone Encounter (Signed)
No name or number identifier, left voicemail.

## 2016-09-02 NOTE — Telephone Encounter (Signed)
Left message on answering machine. 

## 2016-09-05 ENCOUNTER — Encounter: Payer: Self-pay | Admitting: Gastroenterology

## 2016-11-24 ENCOUNTER — Other Ambulatory Visit: Payer: Self-pay

## 2016-11-24 ENCOUNTER — Other Ambulatory Visit (INDEPENDENT_AMBULATORY_CARE_PROVIDER_SITE_OTHER): Payer: 59

## 2016-11-24 DIAGNOSIS — Z0000 Encounter for general adult medical examination without abnormal findings: Secondary | ICD-10-CM

## 2016-11-24 DIAGNOSIS — Z125 Encounter for screening for malignant neoplasm of prostate: Secondary | ICD-10-CM

## 2016-11-24 LAB — CBC WITH DIFFERENTIAL/PLATELET
Basophils Absolute: 0 10*3/uL (ref 0.0–0.1)
Basophils Relative: 0.4 % (ref 0.0–3.0)
Eosinophils Absolute: 0.2 10*3/uL (ref 0.0–0.7)
Eosinophils Relative: 2.7 % (ref 0.0–5.0)
HCT: 41.9 % (ref 39.0–52.0)
Hemoglobin: 13.8 g/dL (ref 13.0–17.0)
Lymphocytes Relative: 25.6 % (ref 12.0–46.0)
Lymphs Abs: 1.9 10*3/uL (ref 0.7–4.0)
MCHC: 33 g/dL (ref 30.0–36.0)
MCV: 91.1 fl (ref 78.0–100.0)
Monocytes Absolute: 0.6 10*3/uL (ref 0.1–1.0)
Monocytes Relative: 7.3 % (ref 3.0–12.0)
Neutro Abs: 4.9 10*3/uL (ref 1.4–7.7)
Neutrophils Relative %: 64 % (ref 43.0–77.0)
Platelets: 259 10*3/uL (ref 150.0–400.0)
RBC: 4.6 Mil/uL (ref 4.22–5.81)
RDW: 13.4 % (ref 11.5–15.5)
WBC: 7.6 10*3/uL (ref 4.0–10.5)

## 2016-11-24 LAB — COMPREHENSIVE METABOLIC PANEL
ALT: 18 U/L (ref 0–53)
AST: 16 U/L (ref 0–37)
Albumin: 3.9 g/dL (ref 3.5–5.2)
Alkaline Phosphatase: 66 U/L (ref 39–117)
BUN: 14 mg/dL (ref 6–23)
CO2: 30 mEq/L (ref 19–32)
Calcium: 9.1 mg/dL (ref 8.4–10.5)
Chloride: 106 mEq/L (ref 96–112)
Creatinine, Ser: 1.12 mg/dL (ref 0.40–1.50)
GFR: 69.52 mL/min (ref >60.00)
Glucose, Bld: 105 mg/dL — ABNORMAL HIGH (ref 70–99)
Potassium: 4.6 mEq/L (ref 3.5–5.1)
Sodium: 142 mEq/L (ref 135–145)
Total Bilirubin: 0.5 mg/dL (ref 0.2–1.2)
Total Protein: 6.6 g/dL (ref 6.0–8.3)

## 2016-11-24 LAB — LIPID PANEL
Cholesterol: 172 mg/dL (ref 0–200)
HDL: 39 mg/dL — ABNORMAL LOW (ref >39.00)
NonHDL: 133.41
Total CHOL/HDL Ratio: 4
Triglycerides: 241 mg/dL — ABNORMAL HIGH (ref 0.0–149.0)
VLDL: 48.2 mg/dL — ABNORMAL HIGH (ref 0.0–40.0)

## 2016-11-24 LAB — PSA: PSA: 0.55 ng/mL (ref 0.10–4.00)

## 2016-11-24 LAB — HEMOGLOBIN A1C: Hgb A1c MFr Bld: 6.2 % (ref 4.6–6.5)

## 2016-11-24 LAB — LDL CHOLESTEROL, DIRECT: Direct LDL: 102 mg/dL

## 2016-11-27 ENCOUNTER — Telehealth: Payer: Self-pay | Admitting: Family Medicine

## 2016-11-27 NOTE — Telephone Encounter (Signed)
Received [employer] financial paperwork via fax for leave of absence. Placing in pickup folder for Dr Juleen China. Please fax to [phone] when completed. $20.00   99080 charge will apply unless otherwise noted by provider. Called patient and confirmed that the leave need is intermittent in nature. He is thinking in worst case scenario, he may need up to 2-3 days per month.   Advised turnaround of 3-5 business days.   He understood all of the above.

## 2016-12-03 NOTE — Telephone Encounter (Signed)
Spoke with patient. He has appointment on 12/29/2016.  He will be picking up physical forms as well as this paperwork when he comes in for that appointment.

## 2016-12-05 NOTE — Telephone Encounter (Signed)
Paperwork has been filled out, signed, and faxed to [employer].

## 2016-12-05 NOTE — Telephone Encounter (Signed)
The [employer] paperwork needed to be faxed to [phone].   Patient is requesting a call from the office today once this has been done due to him receiving an e-mail that it is time sensitive and they never received the fax.   Call patient once this has been done.

## 2016-12-29 ENCOUNTER — Ambulatory Visit (INDEPENDENT_AMBULATORY_CARE_PROVIDER_SITE_OTHER): Payer: 59 | Admitting: Family Medicine

## 2016-12-29 ENCOUNTER — Encounter: Payer: Self-pay | Admitting: *Deleted

## 2016-12-29 ENCOUNTER — Encounter: Payer: Self-pay | Admitting: Family Medicine

## 2016-12-29 ENCOUNTER — Ambulatory Visit (INDEPENDENT_AMBULATORY_CARE_PROVIDER_SITE_OTHER): Payer: 59 | Admitting: *Deleted

## 2016-12-29 VITALS — BP 116/70 | HR 64 | Temp 98.0°F | Ht 69.0 in | Wt 199.8 lb

## 2016-12-29 VITALS — BP 116/70 | HR 64 | Resp 16 | Ht 69.0 in | Wt 199.0 lb

## 2016-12-29 DIAGNOSIS — Z23 Encounter for immunization: Secondary | ICD-10-CM

## 2016-12-29 DIAGNOSIS — E88819 Insulin resistance, unspecified: Secondary | ICD-10-CM

## 2016-12-29 DIAGNOSIS — Z0000 Encounter for general adult medical examination without abnormal findings: Secondary | ICD-10-CM

## 2016-12-29 DIAGNOSIS — E8881 Metabolic syndrome: Secondary | ICD-10-CM

## 2016-12-29 MED ORDER — METFORMIN HCL 1000 MG PO TABS: 1000.0000 mg | ORAL_TABLET | Freq: Two times a day (BID) | ORAL | 3 refills | Status: DC

## 2016-12-29 NOTE — Progress Notes (Signed)
PCP notes:   Health maintenance: Flu: Received today.  Abnormal screenings: MMS score 27.   Patient concerns: None.   Nurse concerns: None.   Next PCP appt: 04/02/2017 10:45.

## 2016-12-29 NOTE — Progress Notes (Signed)
I have personally reviewed the Medicare Annual Wellness questionnaire and have noted 1. The patient's medical and social history 2. Their use of alcohol, tobacco or illicit drugs 3. Their current medications and supplements 4. The patient's functional ability including ADL's, fall risks, home safety risks and hearing or visual impairment. 5. Diet and physical activities 6. Evidence for depression or mood disorders 7. Reviewed Updated provider list, see scanned forms and [facility] Snapshot.   The patients weight, height, BMI and visual acuity have been recorded in the chart I have made referrals, counseling and provided education to the patient based review of the above and I have provided the pt with a written personalized care plan for preventive services.  I have provided the patient with a copy of your personalized plan for preventive services. Instructed to take the time to review along with their updated medication list.   Kani Chauvin, D.O. [facility]  

## 2016-12-29 NOTE — Progress Notes (Signed)
Subjective:   Cesar Green is a 66 y.o. male who presents for an Initial Medicare Annual Wellness Visit.  Review of Systems  No ROS.  Medicare Wellness Visit. Additional risk factors are reflected in the social history.  Pt states that he does not sleep well. States this is chronic. States he intermittently sleeps well but does not feel rested when he wakes up. States he has tried melatonin. States he has had a sleep study in 1983 and he had a bit of apnea.   Cardiac Risk Factors include: advanced age (>87men, >29 women);dyslipidemia;male gender    Objective:    Today's Vitals   12/29/16 1048  BP: 116/70  Pulse: 64  Resp: 16  SpO2: 97%  Weight: 199 lb (90.3 kg)  Height: 5\' 9"  (1.753 m)   Body mass index is 29.39 kg/m.  Current Medications (verified) Outpatient Encounter Prescriptions as of 12/29/2016  Medication Sig  . aspirin 81 MG tablet Take 81 mg by mouth daily.  . DHA-EPA-Vitamin E (OMEGA-3 COMPLEX PO) Take by mouth.  . meloxicam (MOBIC) 15 MG tablet Take 1 tablet by mouth  daily  . omeprazole (PRILOSEC) 40 MG capsule Take 1 capsule (40 mg total) by mouth daily.  . pravastatin (PRAVACHOL) 80 MG tablet TAKE 1 TABLET BY MOUTH  DAILY  . PREVIDENT 5000 DRY MOUTH 1.1 % GEL dental gel   . tamsulosin (FLOMAX) 0.4 MG CAPS capsule Take 1 capsule (0.4 mg total) by mouth daily.   Facility-Administered Encounter Medications as of 12/29/2016  Medication  . 0.9 %  sodium chloride infusion    Allergies (verified) Latex and Testosterone   History: Past Medical History:  Diagnosis Date  . Heart murmur    very slight heart murmur varifeied by cardiologist  . History of tobacco abuse    Quit 1976  . HLD (hyperlipidemia)   . Low testosterone in male   . Nocturia   . OA (osteoarthritis)   . Sleep apnea    number of years ago, not on CPAP   Past Surgical History:  Procedure Laterality Date  . HERNIA REPAIR  1972   Family History  Problem Relation Age of Onset  .  Cancer Mother   . Hypertension Mother   . Heart failure Father   . Diabetes Father   . Depression Father   . Hypertension Father   . Hypertension Sister   . Colon cancer Neg Hx   . Stomach cancer Neg Hx    Social History   Occupational History  .  [employer]   Social History Main Topics  . Smoking status: Former Research scientist (life sciences)  . Smokeless tobacco: Never Used     Comment: Quit 1976  . Alcohol use No  . Drug use: No  . Sexual activity: Yes   Tobacco Counseling Counseling given: Not Answered   Activities of Daily Living In your present state of health, do you have any difficulty performing the following activities: 12/29/2016  Hearing? N  Vision? N  Difficulty concentrating or making decisions? N  Walking or climbing stairs? N  Dressing or bathing? N  Doing errands, shopping? N  Preparing Food and eating ? N  Using the Toilet? N  In the past six months, have you accidently leaked urine? N  Do you have problems with loss of bowel control? N  Managing your Medications? N  Managing your Finances? N  Housekeeping or managing your Housekeeping? N  Some recent data might be hidden    Immunizations and  Health Maintenance Immunization History  Administered Date(s) Administered  . Influenza, High Dose Seasonal PF 12/29/2016  . Influenza,inj,Quad PF,6+ Mos 12/26/2015  . Influenza-Unspecified 01/20/2015  . Pneumococcal Conjugate-13 12/26/2015  . Tdap 12/23/2013   Health Maintenance Due  Topic Date Due  . PNA vac Low Risk Adult (2 of 2 - PPSV23) 12/25/2016    Patient Care Team: Briscoe Deutscher, DO as PCP - General (Family Medicine) Milus Banister, MD as Attending Physician (Gastroenterology) Gerda Diss, DO as Consulting Physician (Family Medicine)  Indicate any recent Medical Services you may have received from other than [facility] providers in the past year (date may be approximate).    Assessment:   This is a routine wellness examination for [facility]. Physical assessment  deferred to PCP.  Hearing/Vision screen Hearing Screening Comments: Checked annually at work, tinnitus present. No hearing aids.  Vision Screening Comments: Annually, [facility] on [address]. Wears contacts.   Dietary issues and exercise activities discussed: Current Exercise Habits: Structured exercise class, Type of exercise: walking;treadmill;strength training/weights, Time (Minutes): > 60, Frequency (Times/Week): 3, Weekly Exercise (Minutes/Week): 0, Exercise limited by: None identified  Drinks 1/2 gallon water/day. Drinks 6 cups coffee/day and 1 cup green tea/day.  Breakfast: 2 pieces wheat toast w/ PB Snack: crackers or protein shakes Lunch: Chicken breast and vegetables.  Snack: [place] yogurt Dinner: Protein shake w/ cheese and almonds   Goals    . Weight (lb) < 180 lb (81.6 kg)      Depression Screen PHQ 2/9 Scores 12/29/2016 03/05/2016 12/26/2015 05/18/2014  PHQ - 2 Score 0 0 0 0  PHQ- 9 Score - 0 - -    Fall Risk Fall Risk  12/29/2016 03/05/2016 12/26/2015 05/18/2014 07/20/2013  Falls in the past year? No No Yes No No  Number falls in past yr: - - 1 - -  Injury with Fall? - - No - -    Cognitive Function: MMSE - Mini Mental State Exam 12/29/2016  Orientation to time 4  Orientation to Place 5  Registration 3  Attention/ Calculation 3  Recall 3  Language- name 2 objects 2  Language- repeat 1  Language- follow 3 step command 3  Language- read & follow direction 1  Write a sentence 1  Copy design 1  Total score 27        Screening Tests Health Maintenance  Topic Date Due  . PNA vac Low Risk Adult (2 of 2 - PPSV23) 12/25/2016  . COLONOSCOPY  08/29/2021  . TETANUS/TDAP  12/24/2023  . INFLUENZA VACCINE  Completed  . Hepatitis C Screening  Completed        Plan:   Follow up with PCP as directed.  Pt will discuss sleep/sleep apnea at next office visit.   I have personally reviewed and noted the following in the patient's chart:   . Medical  and social history . Use of alcohol, tobacco or illicit drugs  . Current medications and supplements . Functional ability and status . Nutritional status . Physical activity . Advanced directives . List of other physicians . Vitals . Screenings to include cognitive, depression, and falls . Referrals and appointments  In addition, I have reviewed and discussed with patient certain preventive protocols, quality metrics, and best practice recommendations. A written personalized care plan for preventive services as well as general preventive health recommendations were provided to patient.     Ree Edman, RN   12/29/2016

## 2016-12-29 NOTE — Progress Notes (Signed)
Pre visit review using our clinic review tool, if applicable. No additional management support is needed unless otherwise documented below in the visit note. 

## 2016-12-29 NOTE — Patient Instructions (Addendum)
Cesar Green ,  Start doing brain stimulating activities (puzzles, reading, adult coloring books, staying active) to keep memory sharp.   Bring a copy of your living will and/or healthcare power of attorney to your next office visit.  Thank you for taking time to come for your Medicare Wellness Visit. I appreciate your ongoing commitment to your health goals. Please review the following plan we discussed and let me know if I can assist you in the future.   These are the goals we discussed: Goals    . Weight (lb) < 180 lb (81.6 kg)       This is a list of the screening recommended for you and due dates:  Health Maintenance  Topic Date Due  . Pneumonia vaccines (2 of 2 - PPSV23) 12/25/2016  . Colon Cancer Screening  08/29/2021  . Tetanus Vaccine  12/24/2023  . Flu Shot  Completed  .  Hepatitis C: One time screening is recommended by Center for Disease Control  (CDC) for  adults born from [birth year] through 1966.   Completed   Preventive Care for Adults  A healthy lifestyle and preventive care can promote health and wellness. Preventive health guidelines for adults include the following key practices.  . A routine yearly physical is a good way to check with your health care provider about your health and preventive screening. It is a chance to share any concerns and updates on your health and to receive a thorough exam.  . Visit your dentist for a routine exam and preventive care every 6 months. Brush your teeth twice a day and floss once a day. Good oral hygiene prevents tooth decay and gum disease.  . The frequency of eye exams is based on your age, health, family medical history, use  of contact lenses, and other factors. Follow your health care provider's ecommendations for frequency of eye exams.  . Eat a healthy diet. Foods like vegetables, fruits, whole grains, low-fat dairy products, and lean protein foods contain the nutrients you need without too many calories. Decrease your intake of  foods high in solid fats, added sugars, and salt. Eat the right amount of calories for you. Get information about a proper diet from your health care provider, if necessary.  . Regular physical exercise is one of the most important things you can do for your health. Most adults should get at least 150 minutes of moderate-intensity exercise (any activity that increases your heart rate and causes you to sweat) each week. In addition, most adults need muscle-strengthening exercises on 2 or more days a week.  Silver Sneakers may be a benefit available to you. To determine eligibility, you may visit the website: www.silversneakers.com or contact program at 503-800-1732 Mon-Fri between 8AM-8PM.   . Maintain a healthy weight. The body mass index (BMI) is a screening tool to identify possible weight problems. It provides an estimate of body fat based on height and weight. Your health care provider can find your BMI and can help you achieve or maintain a healthy weight.   For adults 20 years and older: ? A BMI below 18.5 is considered underweight. ? A BMI of 18.5 to 24.9 is normal. ? A BMI of 25 to 29.9 is considered overweight. ? A BMI of 30 and above is considered obese.   . Maintain normal blood lipids and cholesterol levels by exercising and minimizing your intake of saturated fat. Eat a balanced diet with plenty of fruit and vegetables. Blood tests for lipids and  cholesterol should begin at age 15 and be repeated every 5 years. If your lipid or cholesterol levels are high, you are over 50, or you are at high risk for heart disease, you may need your cholesterol levels checked more frequently. Ongoing high lipid and cholesterol levels should be treated with medicines if diet and exercise are not working.  . If you smoke, find out from your health care provider how to quit. If you do not use tobacco, please do not start.  . If you choose to drink alcohol, please do not consume more than 2 drinks per  day. One drink is considered to be 12 ounces (355 mL) of beer, 5 ounces (148 mL) of wine, or 1.5 ounces (44 mL) of liquor.  . If you are 34-11 years old, ask your health care provider if you should take aspirin to prevent strokes.  . Use sunscreen. Apply sunscreen liberally and repeatedly throughout the day. You should seek shade when your shadow is shorter than you. Protect yourself by wearing long sleeves, pants, a wide-brimmed hat, and sunglasses year round, whenever you are outdoors.  . Once a month, do a whole body skin exam, using a mirror to look at the skin on your back. Tell your health care provider of new moles, moles that have irregular borders, moles that are larger than a pencil eraser, or moles that have changed in shape or color.

## 2016-12-29 NOTE — Progress Notes (Signed)
Cesar Green is a 66 y.o. male is here for follow up.  History of Present Illness:   Cesar Green CMA acting as scribe for Cesar Green.  HPI: Patient comes in today for a follow up on labs.   Lab Results  Component Value Date   HGBA1C 6.2 11/24/2016   Wt Readings from Last 3 Encounters:  12/29/16 199 lb (90.3 kg)  12/29/16 199 lb 12.8 oz (90.6 kg)  08/29/16 199 lb (90.3 kg)   Usual eating pattern includes: The patient eats a regular, healthy diet..     Usual physical activity includes doing aerobic activity, 5 days per week.  Health Maintenance Due  Topic Date Due  . PNA vac Low Risk Adult (2 of 2 - PPSV23) 12/25/2016   Depression screen [facility] 2/9 12/29/2016 03/05/2016 12/26/2015  Decreased Interest 0 0 0  Down, Depressed, Hopeless 0 0 0  PHQ - 2 Score 0 0 0  Altered sleeping - 0 -  Tired, decreased energy - 0 -  Change in appetite - 0 -  Feeling bad or failure about yourself  - 0 -  Trouble concentrating - 0 -  Moving slowly or fidgety/restless - 0 -  Suicidal thoughts - 0 -  PHQ-9 Score - 0 -   PMHx, SurgHx, SocialHx, FamHx, Medications, and Allergies were reviewed in the Visit Navigator and updated as appropriate.   Patient Active Problem List   Diagnosis Date Noted  . Crushing injury of left wrist 08/11/2016  . Hypotestosteronemia 12/26/2015  . Urinary hesitancy 12/15/2014  . Insomnia 12/23/2013  . Hyperlipidemia 06/01/2013  . Right knee pain 06/01/2013   Social History  Substance Use Topics  . Smoking status: Former Research scientist (life sciences)  . Smokeless tobacco: Never Used     Comment: Quit 1976  . Alcohol use No   Current Medications and Allergies:   .  aspirin 81 MG tablet, Take 81 mg by mouth daily., Disp: , Rfl:  .  DHA-EPA-Vitamin E (OMEGA-3 COMPLEX PO), Take by mouth., Disp: , Rfl:  .  meloxicam (MOBIC) 15 MG tablet, Take 1 tablet by mouth  daily, Disp: 90 tablet, Rfl: 1 .  Na Sulfate-K Sulfate-Mg Sulf 17.5-3.13-1.6 GM/180ML SOLN, Take 1 kit by mouth as  directed., Disp: 354 mL, Rfl: 0 .  omeprazole (PRILOSEC) 40 MG capsule, Take 1 capsule (40 mg total) by mouth daily., Disp: 90 capsule, Rfl: 2 .  pravastatin (PRAVACHOL) 80 MG tablet, TAKE 1 TABLET BY MOUTH  DAILY, Disp: 90 tablet, Rfl: 2 .  PREVIDENT 5000 DRY MOUTH 1.1 % GEL dental gel, , Disp: , Rfl:  .  tamsulosin (FLOMAX) 0.4 MG CAPS capsule, Take 1 capsule (0.4 mg total) by mouth daily., Disp: 90 capsule, Rfl: 2   Allergies  Allergen Reactions  . Latex Rash  . Testosterone Hives   Review of Systems   Pertinent items are noted in the HPI. Otherwise, ROS is negative.  Vitals:   Vitals:   12/29/16 0854  BP: 116/70  Pulse: 64  Temp: 98 F (36.7 C)  TempSrc: Oral  SpO2: 97%  Weight: 199 lb 12.8 oz (90.6 kg)  Height: 5' 9" (1.753 m)     Body mass index is 29.51 kg/m.   Physical Exam:   Physical Exam  Constitutional: He is oriented to person, place, and time. He appears well-developed and well-nourished. No distress.  HENT:  Head: Normocephalic and atraumatic.  Right Ear: External ear normal.  Left Ear: External ear normal.  Nose: Nose normal.  Mouth/Throat: Oropharynx is clear and moist.  Eyes: Pupils are equal, round, and reactive to light. Conjunctivae and EOM are normal.  Neck: Normal range of motion. Neck supple.  Cardiovascular: Normal rate, regular rhythm, normal heart sounds and intact distal pulses.   Pulmonary/Chest: Effort normal and breath sounds normal.  Abdominal: Soft. Bowel sounds are normal.  Musculoskeletal: Normal range of motion.  Neurological: He is alert and oriented to person, place, and time.  Skin: Skin is warm and dry.  Psychiatric: He has a normal mood and affect. His behavior is normal. Judgment and thought content normal.  Nursing note and vitals reviewed.  Results for orders placed or performed in visit on 11/24/16  CBC with Differential/Platelet  Result Value Ref Range   WBC 7.6 4.0 - 10.5 K/uL   RBC 4.60 4.22 - 5.81 Mil/uL    Hemoglobin 13.8 13.0 - 17.0 g/dL   HCT 41.9 39.0 - 52.0 %   MCV 91.1 78.0 - 100.0 fl   MCHC 33.0 30.0 - 36.0 g/dL   RDW 13.4 11.5 - 15.5 %   Platelets 259.0 150.0 - 400.0 K/uL   Neutrophils Relative % 64.0 43.0 - 77.0 %   Lymphocytes Relative 25.6 12.0 - 46.0 %   Monocytes Relative 7.3 3.0 - 12.0 %   Eosinophils Relative 2.7 0.0 - 5.0 %   Basophils Relative 0.4 0.0 - 3.0 %   Neutro Abs 4.9 1.4 - 7.7 K/uL   Lymphs Abs 1.9 0.7 - 4.0 K/uL   Monocytes Absolute 0.6 0.1 - 1.0 K/uL   Eosinophils Absolute 0.2 0.0 - 0.7 K/uL   Basophils Absolute 0.0 0.0 - 0.1 K/uL  Comprehensive metabolic panel  Result Value Ref Range   Sodium 142 135 - 145 mEq/L   Potassium 4.6 3.5 - 5.1 mEq/L   Chloride 106 96 - 112 mEq/L   CO2 30 19 - 32 mEq/L   Glucose, Bld 105 (H) 70 - 99 mg/dL   BUN 14 6 - 23 mg/dL   Creatinine, Ser 1.12 0.40 - 1.50 mg/dL   Total Bilirubin 0.5 0.2 - 1.2 mg/dL   Alkaline Phosphatase 66 39 - 117 U/L   AST 16 0 - 37 U/L   ALT 18 0 - 53 U/L   Total Protein 6.6 6.0 - 8.3 g/dL   Albumin 3.9 3.5 - 5.2 g/dL   Calcium 9.1 8.4 - 10.5 mg/dL   GFR 69.52 >60.00 mL/min  Lipid panel  Result Value Ref Range   Cholesterol 172 0 - 200 mg/dL   Triglycerides 241.0 (H) 0.0 - 149.0 mg/dL   HDL 39.00 (L) >39.00 mg/dL   VLDL 48.2 (H) 0.0 - 40.0 mg/dL   Total CHOL/HDL Ratio 4    NonHDL 133.41   PSA  Result Value Ref Range   PSA 0.55 0.10 - 4.00 ng/mL  Hemoglobin A1c  Result Value Ref Range   Hgb A1c MFr Bld 6.2 4.6 - 6.5 %  LDL cholesterol, direct  Result Value Ref Range   Direct LDL 102.0 mg/dL   Assessment and Plan:   Cesar Green was seen today for follow-up.  Diagnoses and all orders for this visit:  Insulin resistance Comments: After discussion, patient would like to start below medication. Expectations, risks, and potential side effects reviewed.  Orders: -     metFORMIN (GLUCOPHAGE) 1000 MG tablet; Take 1 tablet (1,000 mg total) by mouth 2 (two) times daily with a meal.  Encounter  for immunization -     Flu vaccine HIGH DOSE  PF  . Reviewed expectations re: course of current medical issues. . Discussed self-management of symptoms. . Outlined signs and symptoms indicating need for more acute intervention. . Patient verbalized understanding and all questions were answered. Marland Kitchen Health Maintenance issues including appropriate healthy diet, exercise, and smoking avoidance were discussed with patient. . See orders for this visit as documented in the electronic medical record. . Patient received an After Visit Summary.  CMA served as Education administrator during this visit. History, Physical, and Plan performed by medical provider. The above documentation has been reviewed and is accurate and complete. Briscoe Deutscher, D.O.  Briscoe Deutscher, DO [place] [place] 12/30/2016  Future Appointments Date Time Provider [place]  04/02/2017 10:45 AM Briscoe Deutscher, DO [facility] None  12/30/2017 11:00 AM Stephanie Acre, RN [facility] None  01/06/2018 11:00 AM Briscoe Deutscher, DO [facility] None

## 2017-03-15 ENCOUNTER — Other Ambulatory Visit: Payer: Self-pay | Admitting: Family Medicine

## 2017-03-27 ENCOUNTER — Encounter: Payer: Self-pay | Admitting: Family Medicine

## 2017-03-27 ENCOUNTER — Ambulatory Visit: Payer: 59 | Admitting: Family Medicine

## 2017-03-27 VITALS — BP 128/82 | HR 78 | Temp 98.3°F | Ht 69.0 in | Wt 195.6 lb

## 2017-03-27 DIAGNOSIS — J029 Acute pharyngitis, unspecified: Secondary | ICD-10-CM

## 2017-03-27 MED ORDER — IPRATROPIUM BROMIDE 0.06 % NA SOLN: 2.0000 | Freq: Four times a day (QID) | NASAL | 0 refills | Status: AC

## 2017-03-27 MED ORDER — AZITHROMYCIN 250 MG PO TABS: ORAL_TABLET | ORAL | 0 refills | Status: DC

## 2017-03-27 MED ORDER — METHYLPREDNISOLONE ACETATE 80 MG/ML IJ SUSP
80.0000 mg | Freq: Once | INTRAMUSCULAR | Status: AC
  Administered 2017-03-27: 80 mg via INTRAMUSCULAR

## 2017-03-27 NOTE — Progress Notes (Signed)
depo 

## 2017-03-27 NOTE — Progress Notes (Signed)
   Subjective:  Cesar Green is a 66 y.o. male who presents today with a chief complaint of sore throat.   HPI:  Sore throat, acute issue Started about 4-5 days ago. Initially was improving, though has significantly worsened over the past day. Associated with some nasal congestion and rhinorrhea.  No fevers.  No chills.  He has tried several home remedies including warm salt rinses which is helped some.  He is also tried taking a mixture of vinegar and honey which is also help.  Today's noticed that he started to lose his voice.  His daughter has been sick with similar symptoms.  No other sick contacts noted.  Symptoms are worse at night.  No other obvious alleviating or aggravating factors.  ROS: Per HPI  PMH: Smoking history reviewed.  Former smoker.  Objective:  Physical Exam: BP 128/82 (BP Location: Left Arm, Patient Position: Sitting, Cuff Size: Normal)   Pulse 78   Temp 98.3 F (36.8 C) (Oral)   Ht 5\' 9"  (1.753 m)   Wt 195 lb 9.6 oz (88.7 kg)   SpO2 97%   BMI 28.89 kg/m   Gen: NAD, resting comfortably HEENT: TMs clear bilaterally.  Nasal mucosa boggy and erythematous with clear nasal discharge noted.  Maxillary sinuses with mildly decreased transillumination bilaterally.  Oropharynx erythematous without exudate.  No lymphadenopathy noted. CV: RRR with no murmurs appreciated Pulm: NWOB, CTAB with no crackles, wheezes, or rhonchi  Assessment/Plan:  Sore throat No signs of bacterial infection today.  Start Atrovent nasal spray.  Also give 80 mg IM Depo-Medrol.  Encourage patient to use Tylenol and/or Motrin as needed for pain and low-grade fever.  Sent in a "pocket prescription" for azithromycin with strict instruction to not start unless symptoms worsen or fail to improve the next few days.  Return precautions reviewed.  Follow-up as needed.  Algis Greenhouse. Jerline Pain, MD 03/27/2017 4:17 PM

## 2017-03-27 NOTE — Patient Instructions (Signed)
Start the atrovent.  Start the azithromycin if not improving in a few days or if worsening.  Take care, Dr Jerline Pain

## 2017-04-02 ENCOUNTER — Ambulatory Visit: Payer: 59 | Admitting: Family Medicine

## 2017-04-02 ENCOUNTER — Other Ambulatory Visit (INDEPENDENT_AMBULATORY_CARE_PROVIDER_SITE_OTHER): Payer: 59 | Admitting: Surgical

## 2017-04-02 ENCOUNTER — Other Ambulatory Visit: Payer: 59

## 2017-04-02 DIAGNOSIS — E8881 Metabolic syndrome: Secondary | ICD-10-CM | POA: Diagnosis not present

## 2017-04-02 LAB — POCT GLYCOSYLATED HEMOGLOBIN (HGB A1C): Hemoglobin A1C: 5.8

## 2017-04-03 ENCOUNTER — Other Ambulatory Visit: Payer: Self-pay

## 2017-04-03 DIAGNOSIS — E8881 Metabolic syndrome: Secondary | ICD-10-CM

## 2017-04-28 ENCOUNTER — Other Ambulatory Visit: Payer: Self-pay | Admitting: Family Medicine

## 2017-04-28 DIAGNOSIS — R1013 Epigastric pain: Secondary | ICD-10-CM

## 2017-04-28 DIAGNOSIS — R351 Nocturia: Secondary | ICD-10-CM

## 2017-04-30 ENCOUNTER — Telehealth: Payer: Self-pay | Admitting: Family Medicine

## 2017-04-30 NOTE — Telephone Encounter (Signed)
Pt states he called [facility] and was told to contact PCP in order to send new script for Pravachol. Pravachol 80mg  was filled on 03/17/17 with 90 tabs and 2 refills and was sent to [facility].

## 2017-04-30 NOTE — Telephone Encounter (Signed)
Pt    Has   Refills on   Pravachol     At  [facility]     Pt  Notified   To  Call  [facility]  And  Have   Rx   Transferred     Pt  States  He  Has the  Phone  Numbers   And  To  Call us  Back if any  Problems

## 2017-04-30 NOTE — Telephone Encounter (Signed)
Copied from [place] [id number]. Topic: Quick Communication - Rx Refill/Question >> Apr 30, 2017 11:21 AM Clack, Janett Billow D wrote: Medication:  pravastatin (PRAVACHOL) 80 MG tablet [[id number]]    Has the patient contacted their pharmacy? No. Pt has change pharmacy.   (Agent: If no, request that the patient contact the pharmacy for the refill.)   Preferred Pharmacy (with phone number or street name): [facility], [place] [phone] (Phone) [phone] (Fax)     Agent: Please be advised that RX refills may take up to 3 business days. We ask that you follow-up with your pharmacy.

## 2017-04-30 NOTE — Telephone Encounter (Signed)
See note

## 2017-05-01 ENCOUNTER — Other Ambulatory Visit: Payer: Self-pay

## 2017-05-01 MED ORDER — PRAVASTATIN SODIUM 80 MG PO TABS: 80.0000 mg | ORAL_TABLET | Freq: Every day | ORAL | 2 refills | Status: DC

## 2017-05-01 NOTE — Telephone Encounter (Signed)
Script sent to mail order 

## 2017-06-08 ENCOUNTER — Other Ambulatory Visit: Payer: Self-pay | Admitting: Family Medicine

## 2017-06-08 DIAGNOSIS — E8881 Metabolic syndrome: Secondary | ICD-10-CM

## 2017-06-08 DIAGNOSIS — R1013 Epigastric pain: Secondary | ICD-10-CM

## 2017-06-08 DIAGNOSIS — R351 Nocturia: Secondary | ICD-10-CM

## 2017-06-08 NOTE — Telephone Encounter (Signed)
Copied from [place] [id number]. Topic: Quick Communication - Rx Refill/Question >> Jun 08, 2017  2:08 PM [name] A wrote: Medication: Tamsulosin, Omeprazole and Metfomin   Has the patient contacted their pharmacy? No    (Agent: If no, request that the patient contact the pharmacy for the refill.)   Preferred Pharmacy (with phone number or street name): Pt has changed ins provider and needs [facility]  No longer with [facility]  Phone number [phone] Id number [phone]  Agent: Please be advised that RX refills may take up to 3 business days. We ask that you follow-up with your pharmacy.

## 2017-06-09 MED ORDER — TAMSULOSIN HCL 0.4 MG PO CAPS: 0.4000 mg | ORAL_CAPSULE | Freq: Every day | ORAL | 2 refills | Status: DC

## 2017-06-09 MED ORDER — OMEPRAZOLE 40 MG PO CPDR: 40.0000 mg | DELAYED_RELEASE_CAPSULE | Freq: Every day | ORAL | 2 refills | Status: DC

## 2017-06-09 MED ORDER — METFORMIN HCL 1000 MG PO TABS: 1000.0000 mg | ORAL_TABLET | Freq: Two times a day (BID) | ORAL | 3 refills | Status: DC

## 2017-06-09 NOTE — Telephone Encounter (Signed)
Request for new Rx's  For Tamsulosin, Omeprazole, and Metformin to be changed over to [facility], due to Insurance change. Last OV 03/27/17 (acute visit) PCP:  Dr. Billie Ruddy :  [facility]

## 2017-06-09 NOTE — Telephone Encounter (Signed)
See note

## 2017-06-09 NOTE — Telephone Encounter (Signed)
Notified patient that prescriptions have been sent to pharmacy.

## 2017-08-26 LAB — LIPID PANEL
CHOLESTEROL: 152 (ref 0–200)
HDL: 47 (ref 35–70)
LDL Cholesterol: 77
Triglycerides: 142 (ref 40–160)

## 2017-08-26 LAB — TSH: TSH: 1.12 (ref 0.41–5.90)

## 2017-08-26 LAB — BASIC METABOLIC PANEL
BUN: 17 (ref 4–21)
CREATININE: 1.2 (ref 0.6–1.3)
GLUCOSE: 96
Potassium: 4.5 (ref 3.4–5.3)
SODIUM: 144 (ref 137–147)

## 2017-08-26 LAB — PSA: PSA: 0.6

## 2017-08-26 LAB — CBC AND DIFFERENTIAL
HEMATOCRIT: 40 — AB (ref 41–53)
HEMOGLOBIN: 13 — AB (ref 13.5–17.5)
Neutrophils Absolute: 5
PLATELETS: 285 (ref 150–399)
WBC: 7.5

## 2017-08-26 LAB — HEMOGLOBIN A1C: Hemoglobin A1C: 5.8

## 2017-09-22 ENCOUNTER — Telehealth: Payer: Self-pay | Admitting: Family Medicine

## 2017-09-22 ENCOUNTER — Ambulatory Visit: Payer: 59 | Admitting: Family Medicine

## 2017-09-22 ENCOUNTER — Other Ambulatory Visit: Payer: 59

## 2017-09-22 ENCOUNTER — Other Ambulatory Visit: Payer: Self-pay

## 2017-09-22 MED ORDER — PRAVASTATIN SODIUM 80 MG PO TABS: 80.0000 mg | ORAL_TABLET | Freq: Every day | ORAL | 2 refills | Status: DC

## 2017-09-22 NOTE — Telephone Encounter (Signed)
MEDICATION:  pravastatin (PRAVACHOL) 80 MG tablet  PHARMACY:   [facility], [place] [phone] (Phone) [phone] (Fax)     IS THIS A 90 DAY SUPPLY :  Y  IS PATIENT OUT OF MEDICATION: N   IF NOT; HOW MUCH IS LEFT: "couple weeks worth"  LAST APPOINTMENT DATE: @3 /07/2017  NEXT APPOINTMENT DATE:@6 /21/2019  OTHER COMMENTS:    **Let patient know to contact pharmacy at the end of the day to make sure medication is ready. **  ** Please notify patient to allow 48-72 hours to process**  **Encourage patient to contact the pharmacy for refills or they can request refills through [employer]**

## 2017-09-22 NOTE — Telephone Encounter (Signed)
Refill sent in called pt and let him know

## 2017-09-24 DIAGNOSIS — E8881 Metabolic syndrome: Secondary | ICD-10-CM | POA: Insufficient documentation

## 2017-09-24 DIAGNOSIS — E88819 Insulin resistance, unspecified: Secondary | ICD-10-CM | POA: Insufficient documentation

## 2017-09-24 NOTE — Progress Notes (Signed)
Cesar Green is a 67 y.o. male is here for follow up.  History of Present Illness:   HPI:   Current symptoms: no polyuria or polydipsia, no chest pain, dyspnea or TIA's, no numbness, tingling or pain in extremities, weight has decreased.  Taking medication compliantly without noted sided effects [x]   YES  []   NO  Trying to exercise on a regular basis? [x]   YES  []   NO  Lab Results  Component Value Date   HGBA1C 5.6 09/25/2017    No results found for: Derl Barrow  Lab Results  Component Value Date   CHOL 172 11/24/2016   HDL 39.00 (L) 11/24/2016   LDLCALC 143 (H) 12/26/2015   LDLDIRECT 102.0 11/24/2016   TRIG 241.0 (H) 11/24/2016   CHOLHDL 4 11/24/2016     Wt Readings from Last 3 Encounters:  09/25/17 184 lb 9.6 oz (83.7 kg)  03/27/17 195 lb 9.6 oz (88.7 kg)  12/29/16 199 lb (90.3 kg)   BP Readings from Last 3 Encounters:  09/25/17 112/72  03/27/17 128/82  12/29/16 116/70   Lab Results  Component Value Date   CREATININE 1.12 11/24/2016    Health Maintenance Due  Topic Date Due  . PNA vac Low Risk Adult (2 of 2 - PPSV23) 12/25/2016   Depression screen [facility] 2/9 12/29/2016 03/05/2016 12/26/2015  Decreased Interest 0 0 0  Down, Depressed, Hopeless 0 0 0  PHQ - 2 Score 0 0 0  Altered sleeping - 0 -  Tired, decreased energy - 0 -  Change in appetite - 0 -  Feeling bad or failure about yourself  - 0 -  Trouble concentrating - 0 -  Moving slowly or fidgety/restless - 0 -  Suicidal thoughts - 0 -  PHQ-9 Score - 0 -   PMHx, SurgHx, SocialHx, FamHx, Medications, and Allergies were reviewed in the Visit Navigator and updated as appropriate.   Patient Active Problem List   Diagnosis Date Noted  . Insulin resistance 09/24/2017  . Crushing injury of left wrist 08/11/2016  . Hypotestosteronemia 12/26/2015  . Urinary hesitancy 12/15/2014  . Insomnia 12/23/2013  . Hyperlipidemia 06/01/2013  . Right knee pain 06/01/2013   Social History   Tobacco Use    . Smoking status: Former Research scientist (life sciences)  . Smokeless tobacco: Never Used  . Tobacco comment: Quit 1976  Substance Use Topics  . Alcohol use: No  . Drug use: No   Current Medications and Allergies:   Current Outpatient Medications:  .  aspirin 81 MG tablet, Take 81 mg by mouth daily., Disp: , Rfl:  .  DHA-EPA-Vitamin E (OMEGA-3 COMPLEX PO), Take by mouth., Disp: , Rfl:  .  ipratropium (ATROVENT) 0.06 % nasal spray, Place 2 sprays into both nostrils 4 (four) times daily., Disp: 15 mL, Rfl: 0 .  meloxicam (MOBIC) 15 MG tablet, Take 1 tablet by mouth  daily, Disp: 90 tablet, Rfl: 1 .  metFORMIN (GLUCOPHAGE) 1000 MG tablet, Take 1 tablet (1,000 mg total) by mouth 2 (two) times daily with a meal., Disp: 180 tablet, Rfl: 3 .  omeprazole (PRILOSEC) 40 MG capsule, Take 1 capsule (40 mg total) by mouth daily., Disp: 90 capsule, Rfl: 2 .  pravastatin (PRAVACHOL) 80 MG tablet, Take 1 tablet (80 mg total) by mouth daily., Disp: 90 tablet, Rfl: 2 .  PREVIDENT 5000 DRY MOUTH 1.1 % GEL dental gel, , Disp: , Rfl:  .  Probiotic Product (PROBIOTIC ADVANCED PO), Take by mouth., Disp: , Rfl:  .  tamsulosin (FLOMAX) 0.4 MG CAPS capsule, Take 1 capsule (0.4 mg total) by mouth daily., Disp: 90 capsule, Rfl: 2  Current Facility-Administered Medications:  .  0.9 %  sodium chloride infusion, 500 mL, Intravenous, Continuous, Milus Banister, MD   Allergies  Allergen Reactions  . Latex Rash  . Testosterone Hives   Review of Systems   Pertinent items are noted in the HPI. Otherwise, ROS is negative.  Vitals:   Vitals:   09/25/17 1325  BP: 112/72  Pulse: 66  Temp: 97.8 F (36.6 C)  TempSrc: Oral  SpO2: 98%  Weight: 184 lb 9.6 oz (83.7 kg)  Height: 5\' 9"  (1.753 m)     Body mass index is 27.26 kg/m.  Physical Exam:   Physical Exam  Constitutional: He is oriented to person, place, and time. He appears well-developed and well-nourished. No distress.  HENT:  Head: Normocephalic and atraumatic.  Right  Ear: External ear normal.  Left Ear: External ear normal.  Nose: Nose normal.  Mouth/Throat: Oropharynx is clear and moist.  Eyes: Pupils are equal, round, and reactive to light. Conjunctivae and EOM are normal.  Neck: Normal range of motion. Neck supple.  Cardiovascular: Normal rate, regular rhythm, normal heart sounds and intact distal pulses.  Pulmonary/Chest: Effort normal and breath sounds normal.  Abdominal: Soft. Bowel sounds are normal.  Musculoskeletal: Normal range of motion.  Neurological: He is alert and oriented to person, place, and time.  Skin: Skin is warm and dry.  Psychiatric: He has a normal mood and affect. His behavior is normal. Judgment and thought content normal.  Nursing note and vitals reviewed.  Results for orders placed or performed in visit on 09/25/17  POCT glycosylated hemoglobin (Hb A1C)  Result Value Ref Range   Hemoglobin A1C 5.6 4.0 - 5.6 %   HbA1c POC (<> result, manual entry)  4.0 - 5.6 %   HbA1c, POC (prediabetic range)  5.7 - 6.4 %   HbA1c, POC (controlled diabetic range)  0.0 - 7.0 %    Assessment and Plan:   Diagnoses and all orders for this visit:  Insulin resistance Comments: Doing very well. Exercising and eating well. A1c controlled. Will continue current treatment.  Orders: -     POCT glycosylated hemoglobin (Hb A1C)    . Reviewed expectations re: course of current medical issues. . Discussed self-management of symptoms. . Outlined signs and symptoms indicating need for more acute intervention. . Patient verbalized understanding and all questions were answered. Marland Kitchen Health Maintenance issues including appropriate healthy diet, exercise, and smoking avoidance were discussed with patient. . See orders for this visit as documented in the electronic medical record. . Patient received an After Visit Summary.  Briscoe Deutscher, DO [place], [place] 09/27/2017  Future Appointments  Date Time Provider [place]  12/30/2017  11:00 AM Williemae Area, RN [facility]  01/06/2018 11:00 AM Briscoe Deutscher, DO [facility]

## 2017-09-25 ENCOUNTER — Encounter: Payer: Self-pay | Admitting: Family Medicine

## 2017-09-25 ENCOUNTER — Ambulatory Visit: Payer: Managed Care, Other (non HMO) | Admitting: Family Medicine

## 2017-09-25 VITALS — BP 112/72 | HR 66 | Temp 97.8°F | Ht 69.0 in | Wt 184.6 lb

## 2017-09-25 DIAGNOSIS — E88819 Insulin resistance, unspecified: Secondary | ICD-10-CM

## 2017-09-25 DIAGNOSIS — E8881 Metabolic syndrome: Secondary | ICD-10-CM

## 2017-09-25 LAB — POCT GLYCOSYLATED HEMOGLOBIN (HGB A1C): Hemoglobin A1C: 5.6 % (ref 4.0–5.6)

## 2017-09-27 ENCOUNTER — Encounter: Payer: Self-pay | Admitting: Family Medicine

## 2017-09-28 ENCOUNTER — Encounter: Payer: Self-pay | Admitting: Surgical

## 2017-12-30 ENCOUNTER — Ambulatory Visit: Payer: 59

## 2018-01-01 ENCOUNTER — Ambulatory Visit (INDEPENDENT_AMBULATORY_CARE_PROVIDER_SITE_OTHER): Payer: Managed Care, Other (non HMO)

## 2018-01-01 VITALS — BP 116/72 | HR 64 | Temp 97.4°F | Ht 69.0 in | Wt 185.2 lb

## 2018-01-01 DIAGNOSIS — Z0000 Encounter for general adult medical examination without abnormal findings: Secondary | ICD-10-CM | POA: Diagnosis not present

## 2018-01-01 NOTE — Patient Instructions (Addendum)
Cesar Green , Thank you for taking time to come for your Medicare Wellness Visit. I appreciate your ongoing commitment to your health goals. Please review the following plan we discussed and let me know if I can assist you in the future.   These are the goals we discussed: Goals    . Weight (lb) < 180 lb (81.6 kg)       This is a list of the screening recommended for you and due dates:  Health Maintenance  Topic Date Due  . Pneumonia vaccines (2 of 2 - PPSV23) 12/25/2016  . Flu Shot  11/05/2017  . Colon Cancer Screening  08/29/2021  . Tetanus Vaccine  12/24/2023  .  Hepatitis C: One time screening is recommended by Center for Disease Control  (CDC) for  adults born from [birth year] through 1966.   Completed    Preventive Care for Adults  A healthy lifestyle and preventive care can promote health and wellness. Preventive health guidelines for adults include the following key practices.  . A routine yearly physical is a good way to check with your health care provider about your health and preventive screening. It is a chance to share any concerns and updates on your health and to receive a thorough exam.  . Visit your dentist for a routine exam and preventive care every 6 months. Brush your teeth twice a day and floss once a day. Good oral hygiene prevents tooth decay and gum disease.  . The frequency of eye exams is based on your age, health, family medical history, use  of contact lenses, and other factors. Follow your health care provider's recommendations for frequency of eye exams.  . Eat a healthy diet. Foods like vegetables, fruits, whole grains, low-fat dairy products, and lean protein foods contain the nutrients you need without too many calories. Decrease your intake of foods high in solid fats, added sugars, and salt. Eat the right amount of calories for you. Get information about a proper diet from your health care provider, if necessary.  . Regular physical exercise is one of the  most important things you can do for your health. Most adults should get at least 150 minutes of moderate-intensity exercise (any activity that increases your heart rate and causes you to sweat) each week. In addition, most adults need muscle-strengthening exercises on 2 or more days a week.  Silver Sneakers may be a benefit available to you. To determine eligibility, you may visit the website: www.silversneakers.com or contact program at 725-237-5363 Mon-Fri between 8AM-8PM.   . Maintain a healthy weight. The body mass index (BMI) is a screening tool to identify possible weight problems. It provides an estimate of body fat based on height and weight. Your health care provider can find your BMI and can help you achieve or maintain a healthy weight.   For adults 20 years and older: ? A BMI below 18.5 is considered underweight. ? A BMI of 18.5 to 24.9 is normal. ? A BMI of 25 to 29.9 is considered overweight. ? A BMI of 30 and above is considered obese.   . Maintain normal blood lipids and cholesterol levels by exercising and minimizing your intake of saturated fat. Eat a balanced diet with plenty of fruit and vegetables. Blood tests for lipids and cholesterol should begin at age 15 and be repeated every 5 years. If your lipid or cholesterol levels are high, you are over 50, or you are at high risk for heart disease, you may need  your cholesterol levels checked more frequently. Ongoing high lipid and cholesterol levels should be treated with medicines if diet and exercise are not working.  . If you smoke, find out from your health care provider how to quit. If you do not use tobacco, please do not start.  . If you choose to drink alcohol, please do not consume more than 2 drinks per day. One drink is considered to be 12 ounces (355 mL) of beer, 5 ounces (148 mL) of wine, or 1.5 ounces (44 mL) of liquor.  . If you are 8-9 years old, ask your health care provider if you should take aspirin to  prevent strokes.  . Use sunscreen. Apply sunscreen liberally and repeatedly throughout the day. You should seek shade when your shadow is shorter than you. Protect yourself by wearing long sleeves, pants, a wide-brimmed hat, and sunglasses year round, whenever you are outdoors.  . Once a month, do a whole body skin exam, using a mirror to look at the skin on your back. Tell your health care provider of new moles, moles that have irregular borders, moles that are larger than a pencil eraser, or moles that have changed in shape or color.

## 2018-01-01 NOTE — Progress Notes (Signed)
PCP notes: Last OV note 09/25/2017   Health maintenance: Needs Pneumovax 23( will schedule a nurse visit 30 days after the flu shot) and Flu (got Wednesday night at work)   Abnormal screenings: None   Patient concerns: None   Nurse concerns:None   Next PCP appt:  01/06/2018

## 2018-01-01 NOTE — Progress Notes (Signed)
Subjective:   Cesar Green is a 67 y.o. male who presents for Medicare Annual/Subsequent preventive examination.  Review of Systems:  No ROS.  Medicare Wellness Visit. Additional risk factors are reflected in the social history.  Cardiac Risk Factors include: advanced age (>37men, >70 women);male gender  Patient lives in a single story home with wife of 25 years. 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 He also has a younger daughter that lives with them and she has 2 cats. Patient enjoys traveling and works out frequently. Enjoys Arts development officer co-workers. Plans to start hiking with daughter. Enjoys history and visiting historical sites. Enjoys detailing cars.  Works night shift. Get up around 2pm.  Gets off between 7-7:15am. Gets to bed by 8:30am. If off at night will go to bed around 10-10:30pm and get up at 6:30am.    Objective:    Vitals: BP 116/72 (BP Location: Left Arm, Patient Position: Sitting, Cuff Size: Large)   Pulse 64   Temp (!) 97.4 F (36.3 C) (Oral)   Ht 5\' 9"  (1.753 m)   Wt 185 lb 3.2 oz (84 kg)   SpO2 97%   BMI 27.35 kg/m   Body mass index is 27.35 kg/m.  Advanced Directives 01/01/2018 12/29/2016 08/29/2016 03/05/2016 12/26/2015 05/18/2014 07/20/2013  Does Patient Have a Medical Advance Directive? Yes Yes Yes Yes No No Patient has advance directive, copy not in chart  Type of Advance Directive [place];Living will Living will;Healthcare Power of [place];Living will [place];Living will - - -  Does patient want to make changes to medical advance directive? No - Patient declined No - Patient declined - - - - -  Copy of [place] in Chart? No - copy requested No - copy requested No - copy requested No - copy requested - - -  Would patient like information on creating a medical advance directive? - - - - No - patient declined information No - patient declined  information -    Tobacco Social History   Tobacco Use  Smoking Status Former Smoker  Smokeless Tobacco Never Used  Tobacco Comment   Quit 1976     Counseling given: Not Answered Comment: Quit 1976        Past Medical History:  Diagnosis Date  . Heart murmur    very slight heart murmur varifeied by cardiologist  . History of tobacco abuse    Quit 1976  . HLD (hyperlipidemia)   . Low testosterone in male   . Nocturia   . OA (osteoarthritis)   . Sleep apnea    number of years ago, not on CPAP   Past Surgical History:  Procedure Laterality Date  . Basal Cell Cancer     Face  . [place]  . MOHS SURGERY  2007   Right side of nose  . VASECTOMY  1983   Family History  Problem Relation Age of Onset  . Cancer Mother   . Hypertension Mother   . Heart failure Father   . Diabetes Father   . Depression Father   . Hypertension Father   . Hypertension Sister   . Cancer Maternal Grandmother   . Tuberculosis Maternal Grandfather   . Stroke Paternal Grandmother   . Tuberculosis Paternal Grandfather   . Cerebral palsy Daughter   . Seizures Daughter   . Blindness Daughter   . Scoliosis Daughter   .  Colon cancer Neg Hx   . Stomach cancer Neg Hx    Social History   Socioeconomic History  . Marital status: Married    Spouse name: Not on file  . Number of children: 2  . Years of education: Not on file  . Highest education level: Not on file  Occupational History    Employer: [employer]  . Financial resource strain: Not on file  . Food insecurity:    Worry: Not on file    Inability: Not on file  . Transportation needs:    Medical: Not on file    Non-medical: Not on file  Tobacco Use  . Smoking status: Former Research scientist (life sciences)  . Smokeless tobacco: Never Used  . Tobacco comment: Quit 1976  Substance and Sexual Activity  . Alcohol use: Yes    Comment: 2-3oz red wine 3 nights a week  . Drug use: No  . Sexual activity: Yes  Lifestyle  . Physical  activity:    Days per week: Not on file    Minutes per session: Not on file  . Stress: Not on file  Relationships  . Social connections:    Talks on phone: Not on file    Gets together: Not on file    Attends religious service: Not on file    Active member of club or organization: Not on file    Attends meetings of clubs or organizations: Not on file    Relationship status: Not on file  Other Topics Concern  . Not on file  Social History Narrative   5-6 cups of coffee per day. Works second shift. HIIT several days per week.    Outpatient Encounter Medications as of 01/01/2018  Medication Sig  . aspirin 81 MG tablet Take 81 mg by mouth every other day.   . DHA-EPA-Vitamin E (OMEGA-3 COMPLEX PO) Take by mouth.  Marland Kitchen ipratropium (ATROVENT) 0.06 % nasal spray Place 2 sprays into both nostrils 4 (four) times daily.  . meloxicam (MOBIC) 15 MG tablet Take 1 tablet by mouth  daily  . metFORMIN (GLUCOPHAGE) 1000 MG tablet Take 1 tablet (1,000 mg total) by mouth 2 (two) times daily with a meal.  . omeprazole (PRILOSEC) 40 MG capsule Take 1 capsule (40 mg total) by mouth daily.  . pravastatin (PRAVACHOL) 80 MG tablet Take 1 tablet (80 mg total) by mouth daily.  Marland Kitchen PREVIDENT 5000 DRY MOUTH 1.1 % GEL dental gel   . Probiotic Product (PROBIOTIC ADVANCED PO) Take by mouth.  . tamsulosin (FLOMAX) 0.4 MG CAPS capsule Take 1 capsule (0.4 mg total) by mouth daily.  . [DISCONTINUED] 0.9 %  sodium chloride infusion    No facility-administered encounter medications on file as of 01/01/2018.     Activities of Daily Living In your present state of health, do you have any difficulty performing the following activities: 01/01/2018  Hearing? N  Vision? N  Difficulty concentrating or making decisions? N  Walking or climbing stairs? N  Dressing or bathing? N  Doing errands, shopping? N  Preparing Food and eating ? N  Using the Toilet? N  In the past six months, have you accidently leaked urine? N  Do you  have problems with loss of bowel control? N  Managing your Medications? N  Managing your Finances? N  Housekeeping or managing your Housekeeping? N  Some recent data might be hidden    Patient Care Team: Briscoe Deutscher, DO as PCP - General (Family Medicine) Milus Banister,  MD as Attending Physician (Gastroenterology) Gerda Diss, DO as Consulting Physician (Family Medicine)   Assessment:   This is a routine wellness examination for [facility].  Exercise Activities and Dietary recommendations Current Exercise Habits: Structured exercise class([employer]), Type of exercise: strength training/weights, Time (Minutes): > 60, Frequency (Times/Week): 3, Weekly Exercise (Minutes/Week): 0, Intensity: Moderate, Exercise limited by: None identified   Breakfast: toast, protein shake, instant oatmeal, rice Chex, Coffee (black)  Lunch: Cheese Sandwich, salad, protein shake and slice of cheese, drinks water  Dinner: vegetables, pasta, Stir fry, drinks water  Drinks 2-3oz of red wine in the evening about 3 nights of the week. Drinks a cup of warm green tea every day. Goals    . Weight (lb) < 180 lb (81.6 kg)       Fall Risk Fall Risk  01/01/2018 12/29/2016 03/05/2016 12/26/2015 05/18/2014  Falls in the past year? No No No Yes No  Number falls in past yr: - - - 1 -  Injury with Fall? - - - No -    Depression Screen PHQ 2/9 Scores 01/01/2018 12/29/2016 03/05/2016 12/26/2015  PHQ - 2 Score 0 0 0 0  PHQ- 9 Score - - 0 -    Cognitive Function MMSE - Mini Mental State Exam 12/29/2016  Orientation to time 4  Orientation to Place 5  Registration 3  Attention/ Calculation 3  Recall 3  Language- name 2 objects 2  Language- repeat 1  Language- follow 3 step command 3  Language- read & follow direction 1  Write a sentence 1  Copy design 1  Total score 27     6CIT Screen 01/01/2018  What Year? 0 points  What time? 0 points  Count back from 20 0 points  Months in reverse 0 points    Repeat phrase 0 points    Immunization History  Administered Date(s) Administered  . Influenza, High Dose Seasonal PF 12/29/2016  . Influenza,inj,Quad PF,6+ Mos 12/26/2015  . Influenza-Unspecified 01/20/2015  . Pneumococcal Conjugate-13 12/26/2015  . Tdap 12/23/2013      Screening Tests Health Maintenance  Topic Date Due  . PNA vac Low Risk Adult (2 of 2 - PPSV23) 12/25/2016  . INFLUENZA VACCINE  11/05/2017  . COLONOSCOPY  08/29/2021  . TETANUS/TDAP  12/24/2023  . Hepatitis C Screening  Completed         Plan:    Follow Up with PCP as advised  I have personally reviewed and noted the following in the patient's chart:   . Medical and social history . Use of alcohol, tobacco or illicit drugs  . Current medications and supplements . Functional ability and status . Nutritional status . Physical activity . Advanced directives . List of other physicians . Vitals . Screenings to include cognitive, depression, and falls . Referrals and appointments  In addition, I have reviewed and discussed with patient certain preventive protocols, quality metrics, and best practice recommendations. A written personalized care plan for preventive services as well as general preventive health recommendations were provided to patient.     [place], [place]  1/77/1165

## 2018-01-01 NOTE — Progress Notes (Signed)
I have personally reviewed the Medicare Annual Wellness questionnaire and have noted 1. The patient's medical and social history 2. Their use of alcohol, tobacco or illicit drugs 3. Their current medications and supplements 4. The patient's functional ability including ADL's, fall risks, home safety risks and hearing or visual impairment. 5. Diet and physical activities 6. Evidence for depression or mood disorders 7. Reviewed Updated provider list, see scanned forms and [facility] Snapshot.   The patients weight, height, BMI and visual acuity have been recorded in the chart I have made referrals, counseling and provided education to the patient based review of the above and I have provided the pt with a written personalized care plan for preventive services.  I have provided the patient with a copy of your personalized plan for preventive services. Instructed to take the time to review along with their updated medication list.  Kaytlin Burklow, DO  

## 2018-01-06 ENCOUNTER — Encounter: Payer: 59 | Admitting: Family Medicine

## 2018-01-06 NOTE — Progress Notes (Deleted)
Cesar Green is a 67 y.o. male is here for follow up.  History of Present Illness:   {CMA SCRIBE ATTESTATION}  HPI:   Health Maintenance Due  Topic Date Due  . PNA vac Low Risk Adult (2 of 2 - PPSV23) 12/25/2016   Depression screen [facility] 2/9 01/01/2018 12/29/2016 03/05/2016  Decreased Interest 0 0 0  Down, Depressed, Hopeless 0 0 0  PHQ - 2 Score 0 0 0  Altered sleeping - - 0  Tired, decreased energy - - 0  Change in appetite - - 0  Feeling bad or failure about yourself  - - 0  Trouble concentrating - - 0  Moving slowly or fidgety/restless - - 0  Suicidal thoughts - - 0  PHQ-9 Score - - 0   PMHx, SurgHx, SocialHx, FamHx, Medications, and Allergies were reviewed in the Visit Navigator and updated as appropriate.   Patient Active Problem List   Diagnosis Date Noted  . Insulin resistance 09/24/2017  . Crushing injury of left wrist 08/11/2016  . Hypotestosteronemia 12/26/2015  . Urinary hesitancy 12/15/2014  . Insomnia 12/23/2013  . Hyperlipidemia 06/01/2013  . Right knee pain 06/01/2013   Social History   Tobacco Use  . Smoking status: Former Research scientist (life sciences)  . Smokeless tobacco: Never Used  . Tobacco comment: Quit 1976  Substance Use Topics  . Alcohol use: Yes    Comment: 2-3oz red wine 3 nights a week  . Drug use: No   Current Medications and Allergies:   Current Outpatient Medications:  .  aspirin 81 MG tablet, Take 81 mg by mouth every other day. , Disp: , Rfl:  .  DHA-EPA-Vitamin E (OMEGA-3 COMPLEX PO), Take by mouth., Disp: , Rfl:  .  ipratropium (ATROVENT) 0.06 % nasal spray, Place 2 sprays into both nostrils 4 (four) times daily., Disp: 15 mL, Rfl: 0 .  meloxicam (MOBIC) 15 MG tablet, Take 1 tablet by mouth  daily, Disp: 90 tablet, Rfl: 1 .  metFORMIN (GLUCOPHAGE) 1000 MG tablet, Take 1 tablet (1,000 mg total) by mouth 2 (two) times daily with a meal., Disp: 180 tablet, Rfl: 3 .  omeprazole (PRILOSEC) 40 MG capsule, Take 1 capsule (40 mg total) by mouth daily.,  Disp: 90 capsule, Rfl: 2 .  pravastatin (PRAVACHOL) 80 MG tablet, Take 1 tablet (80 mg total) by mouth daily., Disp: 90 tablet, Rfl: 2 .  PREVIDENT 5000 DRY MOUTH 1.1 % GEL dental gel, , Disp: , Rfl:  .  Probiotic Product (PROBIOTIC ADVANCED PO), Take by mouth., Disp: , Rfl:  .  tamsulosin (FLOMAX) 0.4 MG CAPS capsule, Take 1 capsule (0.4 mg total) by mouth daily., Disp: 90 capsule, Rfl: 2  Allergies  Allergen Reactions  . Latex Rash  . Testosterone Hives   Review of Systems   Pertinent items are noted in the HPI. Otherwise, ROS is negative.  Vitals:  There were no vitals filed for this visit.   There is no height or weight on file to calculate BMI.  Physical Exam:   Physical Exam  Results for orders placed or performed in visit on 09/28/17  CBC and differential  Result Value Ref Range   Hemoglobin 13.0 (A) 13.5 - 17.5   HCT 40 (A) 41 - 53   WBC 7.5   CBC and differential  Result Value Ref Range   Neutrophils Absolute 5    Platelets 285 150 - 427  Basic metabolic panel  Result Value Ref Range   BUN 17 4 -  21   Potassium 4.5 3.4 - 5.3   Sodium 144 137 - 076  Basic metabolic panel  Result Value Ref Range   Glucose 96    Creatinine 1.2 0.6 - 1.3  Lipid panel  Result Value Ref Range   Triglycerides 142 40 - 160   Cholesterol 152 0 - 200   HDL 47 35 - 70   LDL Cholesterol 77   Hemoglobin A1c  Result Value Ref Range   Hemoglobin A1C 5.8   PSA  Result Value Ref Range   PSA 0.6   TSH  Result Value Ref Range   TSH 1.12 0.41 - 5.90    Assessment and Plan:   There are no diagnoses linked to this encounter.  . Reviewed expectations re: course of current medical issues. . Discussed self-management of symptoms. . Outlined signs and symptoms indicating need for more acute intervention. . Patient verbalized understanding and all questions were answered. Marland Kitchen Health Maintenance issues including appropriate healthy diet, exercise, and smoking avoidance were discussed  with patient. . See orders for this visit as documented in the electronic medical record. . Patient received an After Visit Summary.  *** CMA served as Education administrator during this visit. History, Physical, and Plan performed by medical provider. The above documentation has been reviewed and is accurate and complete. Briscoe Deutscher, D.O.  Briscoe Deutscher, DO [place], [place] [facility] 01/06/2018

## 2018-01-19 ENCOUNTER — Encounter: Payer: Managed Care, Other (non HMO) | Admitting: Family Medicine

## 2018-01-25 ENCOUNTER — Encounter: Payer: Managed Care, Other (non HMO) | Admitting: Family Medicine

## 2018-01-26 ENCOUNTER — Encounter: Payer: Self-pay | Admitting: Family Medicine

## 2018-01-26 ENCOUNTER — Ambulatory Visit (INDEPENDENT_AMBULATORY_CARE_PROVIDER_SITE_OTHER): Payer: Managed Care, Other (non HMO) | Admitting: Family Medicine

## 2018-01-26 VITALS — BP 120/74 | HR 72 | Temp 98.0°F | Ht 69.0 in | Wt 184.6 lb

## 2018-01-26 DIAGNOSIS — Z23 Encounter for immunization: Secondary | ICD-10-CM

## 2018-01-26 DIAGNOSIS — Z0000 Encounter for general adult medical examination without abnormal findings: Secondary | ICD-10-CM | POA: Diagnosis not present

## 2018-01-26 DIAGNOSIS — E88819 Insulin resistance, unspecified: Secondary | ICD-10-CM

## 2018-01-26 DIAGNOSIS — E8881 Metabolic syndrome: Secondary | ICD-10-CM

## 2018-01-26 DIAGNOSIS — E7800 Pure hypercholesterolemia, unspecified: Secondary | ICD-10-CM

## 2018-01-26 DIAGNOSIS — R5383 Other fatigue: Secondary | ICD-10-CM | POA: Diagnosis not present

## 2018-01-26 LAB — LIPID PANEL
Cholesterol: 173 mg/dL (ref 0–200)
HDL: 46.3 mg/dL (ref >39.00)
LDL Cholesterol: 88 mg/dL (ref 0–99)
NonHDL: 126.35
Total CHOL/HDL Ratio: 4
Triglycerides: 190 mg/dL — ABNORMAL HIGH (ref 0.0–149.0)
VLDL: 38 mg/dL (ref 0.0–40.0)

## 2018-01-26 LAB — COMPREHENSIVE METABOLIC PANEL
ALT: 19 U/L (ref 0–53)
AST: 14 U/L (ref 0–37)
Albumin: 4.3 g/dL (ref 3.5–5.2)
Alkaline Phosphatase: 63 U/L (ref 39–117)
BUN: 20 mg/dL (ref 6–23)
CO2: 31 mEq/L (ref 19–32)
Calcium: 9.4 mg/dL (ref 8.4–10.5)
Chloride: 103 mEq/L (ref 96–112)
Creatinine, Ser: 1.26 mg/dL (ref 0.40–1.50)
GFR: 60.47 mL/min (ref >60.00)
Glucose, Bld: 105 mg/dL — ABNORMAL HIGH (ref 70–99)
Potassium: 4.4 mEq/L (ref 3.5–5.1)
Sodium: 140 mEq/L (ref 135–145)
Total Bilirubin: 0.5 mg/dL (ref 0.2–1.2)
Total Protein: 7 g/dL (ref 6.0–8.3)

## 2018-01-26 LAB — CBC WITH DIFFERENTIAL/PLATELET
Basophils Absolute: 0 10*3/uL (ref 0.0–0.1)
Basophils Relative: 0.3 % (ref 0.0–3.0)
Eosinophils Absolute: 0.2 10*3/uL (ref 0.0–0.7)
Eosinophils Relative: 2.9 % (ref 0.0–5.0)
HCT: 39.5 % (ref 39.0–52.0)
Hemoglobin: 13.6 g/dL (ref 13.0–17.0)
Lymphocytes Relative: 22.7 % (ref 12.0–46.0)
Lymphs Abs: 1.6 10*3/uL (ref 0.7–4.0)
MCHC: 34.5 g/dL (ref 30.0–36.0)
MCV: 88.3 fl (ref 78.0–100.0)
Monocytes Absolute: 0.5 10*3/uL (ref 0.1–1.0)
Monocytes Relative: 7.7 % (ref 3.0–12.0)
Neutro Abs: 4.6 10*3/uL (ref 1.4–7.7)
Neutrophils Relative %: 66.4 % (ref 43.0–77.0)
Platelets: 265 10*3/uL (ref 150.0–400.0)
RBC: 4.48 Mil/uL (ref 4.22–5.81)
RDW: 13.1 % (ref 11.5–15.5)
WBC: 7 10*3/uL (ref 4.0–10.5)

## 2018-01-26 LAB — HEMOGLOBIN A1C: Hgb A1c MFr Bld: 6 % (ref 4.6–6.5)

## 2018-01-26 LAB — TSH: TSH: 1.1 u[IU]/mL (ref 0.35–4.50)

## 2018-01-26 NOTE — Progress Notes (Signed)
Subjective:    Cesar Green is a 67 y.o. male who presents today for his Complete Annual Exam.   Current Outpatient Medications:  .  aspirin 81 MG tablet, Take 81 mg by mouth every other day. , Disp: , Rfl:  .  DHA-EPA-Vitamin E (OMEGA-3 COMPLEX PO), Take by mouth., Disp: , Rfl:  .  ipratropium (ATROVENT) 0.06 % nasal spray, Place 2 sprays into both nostrils 4 (four) times daily., Disp: 15 mL, Rfl: 0 .  meloxicam (MOBIC) 15 MG tablet, Take 1 tablet by mouth  daily, Disp: 90 tablet, Rfl: 1 .  metFORMIN (GLUCOPHAGE) 1000 MG tablet, Take 1 tablet (1,000 mg total) by mouth 2 (two) times daily with a meal., Disp: 180 tablet, Rfl: 3 .  omeprazole (PRILOSEC) 40 MG capsule, Take 1 capsule (40 mg total) by mouth daily., Disp: 90 capsule, Rfl: 2 .  pravastatin (PRAVACHOL) 80 MG tablet, Take 1 tablet (80 mg total) by mouth daily., Disp: 90 tablet, Rfl: 2 .  PREVIDENT 5000 DRY MOUTH 1.1 % GEL dental gel, , Disp: , Rfl:  .  Probiotic Product (PROBIOTIC ADVANCED PO), Take by mouth., Disp: , Rfl:  .  tamsulosin (FLOMAX) 0.4 MG CAPS capsule, Take 1 capsule (0.4 mg total) by mouth daily., Disp: 90 capsule, Rfl: 2  There are no preventive care reminders to display for this patient.  PMHx, SurgHx, SocialHx, Medications, and Allergies were reviewed in the Visit Navigator and updated as appropriate.   Past Medical History:  Diagnosis Date  . Heart murmur    very slight heart murmur varifeied by cardiologist  . History of tobacco abuse    Quit 1976  . HLD (hyperlipidemia)   . Low testosterone in male   . Nocturia   . OA (osteoarthritis)   . Sleep apnea    number of years ago, not on CPAP     Past Surgical History:  Procedure Laterality Date  . Basal Cell Cancer     Face  . [place]  . MOHS SURGERY  2007   Right side of nose  . VASECTOMY  1983     Family History  Problem Relation Age of Onset  . Cancer Mother   . Hypertension Mother   . Heart failure Father   . Diabetes  Father   . Depression Father   . Hypertension Father   . Hypertension Sister   . Cancer Maternal Grandmother   . Tuberculosis Maternal Grandfather   . Stroke Paternal Grandmother   . Tuberculosis Paternal Grandfather   . Cerebral palsy Daughter   . Seizures Daughter   . Blindness Daughter   . Scoliosis Daughter   . Colon cancer Neg Hx   . Stomach cancer Neg Hx     Social History   Tobacco Use  . Smoking status: Former Research scientist (life sciences)  . Smokeless tobacco: Never Used  . Tobacco comment: Quit 1976  Substance Use Topics  . Alcohol use: Yes    Comment: 2-3oz red wine 3 nights a week  . Drug use: No    Review of Systems:   Pertinent items are noted in the HPI. Otherwise, ROS is negative.  Objective:    Vitals:   01/26/18 1251  BP: 120/74  Pulse: 72  Temp: 98 F (36.7 C)  SpO2: 97%   Body mass index is 27.26 kg/m.  General  Alert, cooperative, no distress, appears stated age  Head:  Normocephalic, without obvious abnormality, atraumatic  Eyes:  PERRL, conjunctiva/corneas clear,  EOM's intact, fundi benign, both eyes       Ears:  Normal TM's and external ear canals, both ears  Nose: Nares normal, septum midline, mucosa normal, no drainage or sinus tenderness  Throat: Lips, mucosa, and tongue normal; teeth and gums normal  Neck: Supple, symmetrical, trachea midline, no adenopathy; thyroid: no enlargement/tenderness/nodules; no carotid bruit or JVD  Back:   Symmetric, no curvature, ROM normal, no CVA tenderness  Lungs:   Clear to auscultation bilaterally, respirations unlabored  Chest Wall:  No tenderness or deformity  Heart:  Regular rate and rhythm, S1 and S2 normal, no murmur, rub or gallop  Abdomen:   Soft, non-tender, bowel sounds active all four quadrants, no masses, no organomegaly  Extremities: Extremities normal, atraumatic, no cyanosis or edema  Prostate : Not done  Skin: Skin color, texture, turgor normal, no rashes or lesions  Lymph: Cervical, supraclavicular,  and axillary nodes normal  Neurologic: CNII-XII grossly intact. Normal strength, sensation and reflexes throughout    AssessmentPlan:   Cesar Green was seen today for annual exam.  Diagnoses and all orders for this visit:  Routine physical examination  Insulin resistance -     Comprehensive metabolic panel -     Hemoglobin A1c  Pure hypercholesterolemia -     Comprehensive metabolic panel -     Lipid panel  Fatigue, unspecified type -     CBC with Differential/Platelet -     TSH  Need for prophylactic vaccination against Streptococcus pneumoniae (pneumococcus) -     Cancel: Pneumococcal polysaccharide vaccine 23-valent greater than or equal to 2yo subcutaneous/IM; Future -     Pneumococcal polysaccharide vaccine 23-valent greater than or equal to 2yo subcutaneous/IM   Patient Counseling: [x]   Nutrition: Stressed importance of moderation in sodium/caffeine intake, saturated fat and cholesterol, caloric balance, sufficient intake of fresh fruits, vegetables, and fiber  [x]   Stressed the importance of regular exercise.   []   Substance Abuse: Discussed cessation/primary prevention of tobacco, alcohol, or other drug use; driving or other dangerous activities under the influence; availability of treatment for abuse.   [x]   Injury prevention: Discussed safety belts, safety helmets, smoke detector, smoking near bedding or upholstery.   []   Sexuality: Discussed sexually transmitted diseases, partner selection, use of condoms, avoidance of unintended pregnancy  and contraceptive alternatives.   [x]   Dental health: Discussed importance of regular tooth brushing, flossing, and dental visits.  [x]   Health maintenance and immunizations reviewed. Please refer to Health maintenance section.    Briscoe Deutscher, DO [place]

## 2018-03-19 ENCOUNTER — Other Ambulatory Visit: Payer: Self-pay | Admitting: Family Medicine

## 2018-03-19 DIAGNOSIS — R351 Nocturia: Secondary | ICD-10-CM

## 2018-03-19 DIAGNOSIS — R1013 Epigastric pain: Secondary | ICD-10-CM

## 2018-06-29 ENCOUNTER — Other Ambulatory Visit: Payer: Self-pay | Admitting: Family Medicine

## 2018-06-29 MED ORDER — PRAVASTATIN SODIUM 80 MG PO TABS: 80.0000 mg | ORAL_TABLET | Freq: Every day | ORAL | 2 refills | Status: DC

## 2018-07-08 ENCOUNTER — Telehealth: Payer: Self-pay | Admitting: Family Medicine

## 2018-07-08 DIAGNOSIS — E8881 Metabolic syndrome: Secondary | ICD-10-CM

## 2018-07-08 DIAGNOSIS — R351 Nocturia: Secondary | ICD-10-CM

## 2018-07-08 DIAGNOSIS — R1013 Epigastric pain: Secondary | ICD-10-CM

## 2018-07-08 DIAGNOSIS — E88819 Insulin resistance, unspecified: Secondary | ICD-10-CM

## 2018-07-08 NOTE — Telephone Encounter (Signed)
Copied from [place] [id number]. Topic: Quick Communication - See Telephone Encounter >> Jul 08, 2018 12:20 PM [name] wrote: CRM for notification. See Telephone encounter for: 07/08/18. Patient would like a refill on the following medications and have them sent to his preferred pharmacy [facility]:  1) omeprazole (PRILOSEC) 40 MG capsule  2) tamsulosin (FLOMAX) 0.4 MG CAPS capsule  3)  metFORMIN (GLUCOPHAGE) 1000 MG tablet

## 2018-07-09 MED ORDER — TAMSULOSIN HCL 0.4 MG PO CAPS: 0.4000 mg | ORAL_CAPSULE | Freq: Every day | ORAL | 0 refills | Status: DC

## 2018-07-09 MED ORDER — METFORMIN HCL 1000 MG PO TABS: 1000.0000 mg | ORAL_TABLET | Freq: Two times a day (BID) | ORAL | 0 refills | Status: DC

## 2018-07-09 MED ORDER — OMEPRAZOLE 40 MG PO CPDR: 40.0000 mg | DELAYED_RELEASE_CAPSULE | Freq: Every day | ORAL | 0 refills | Status: DC

## 2018-07-09 NOTE — Telephone Encounter (Signed)
Rx refilled to [facility].

## 2018-07-18 IMAGING — DX DG WRIST COMPLETE 3+V*L*
4 series · 4 of 4 positions shown · non-contrast
Comparison: None available

CLINICAL DATA: Injury, anterior wrist pain

EXAM:
LEFT WRIST - COMPLETE 3+ VIEW

[wrist pa]
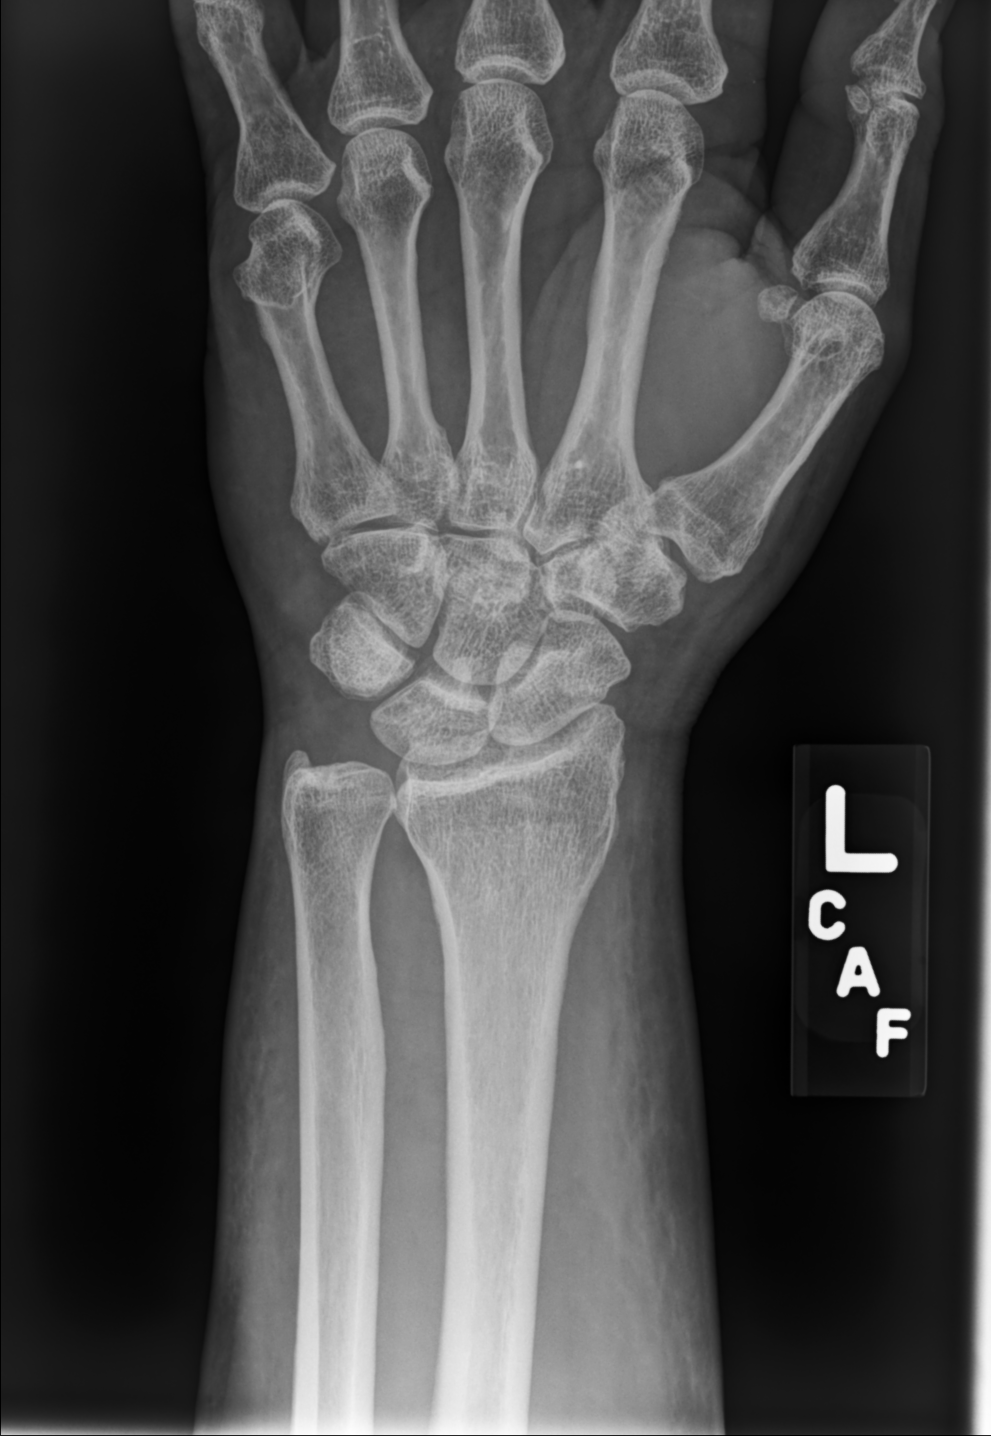

[wrist oblique (1 of 2)]
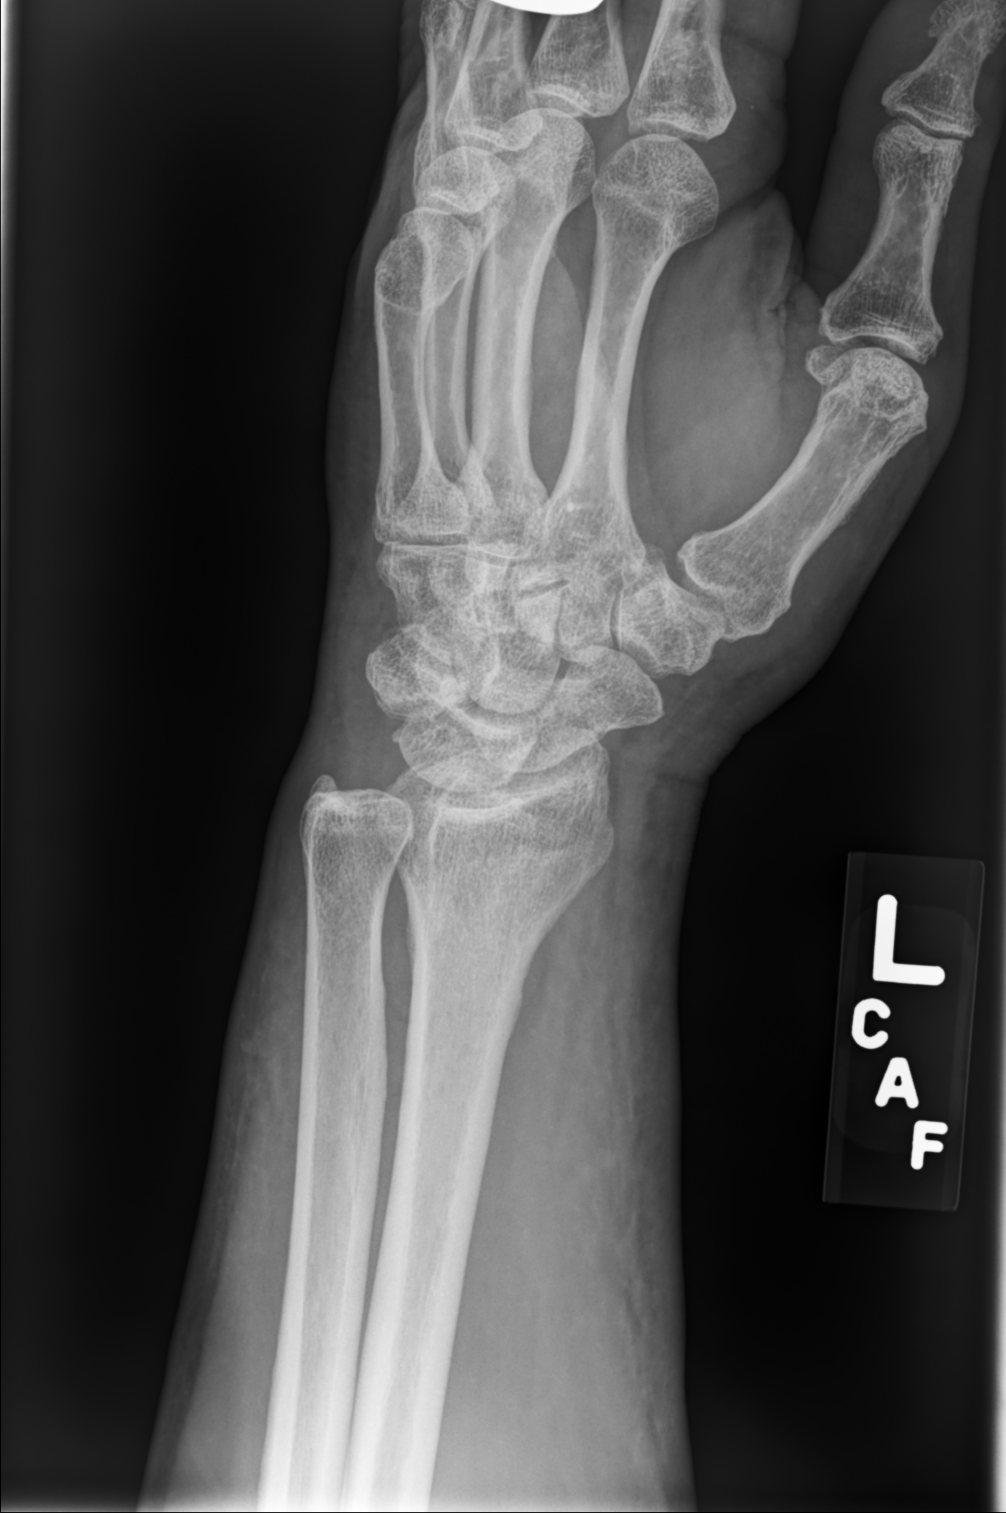

[wrist lat]
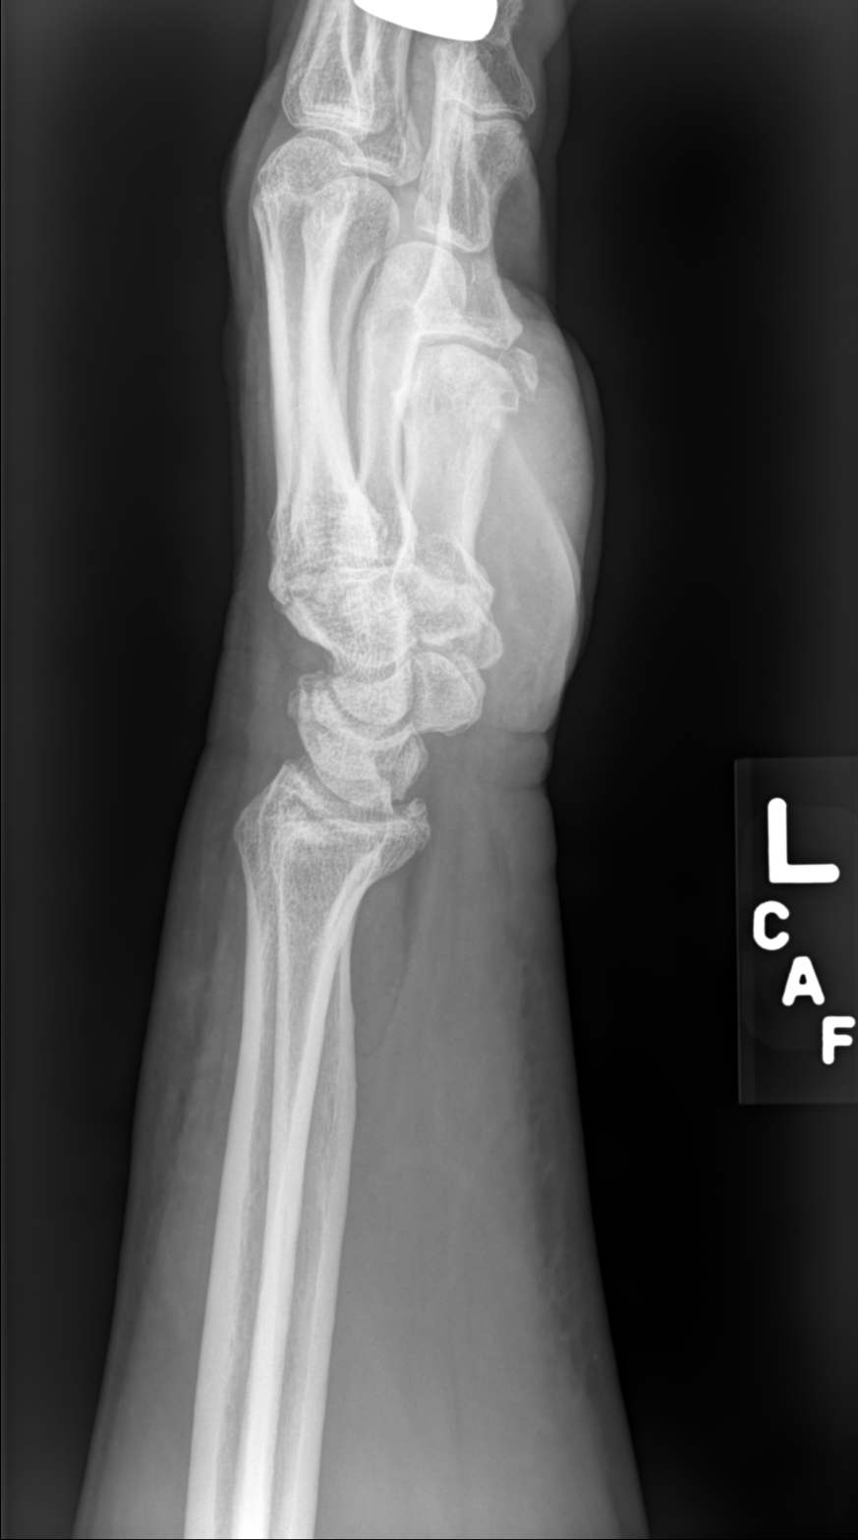

[wrist oblique (2 of 2)]
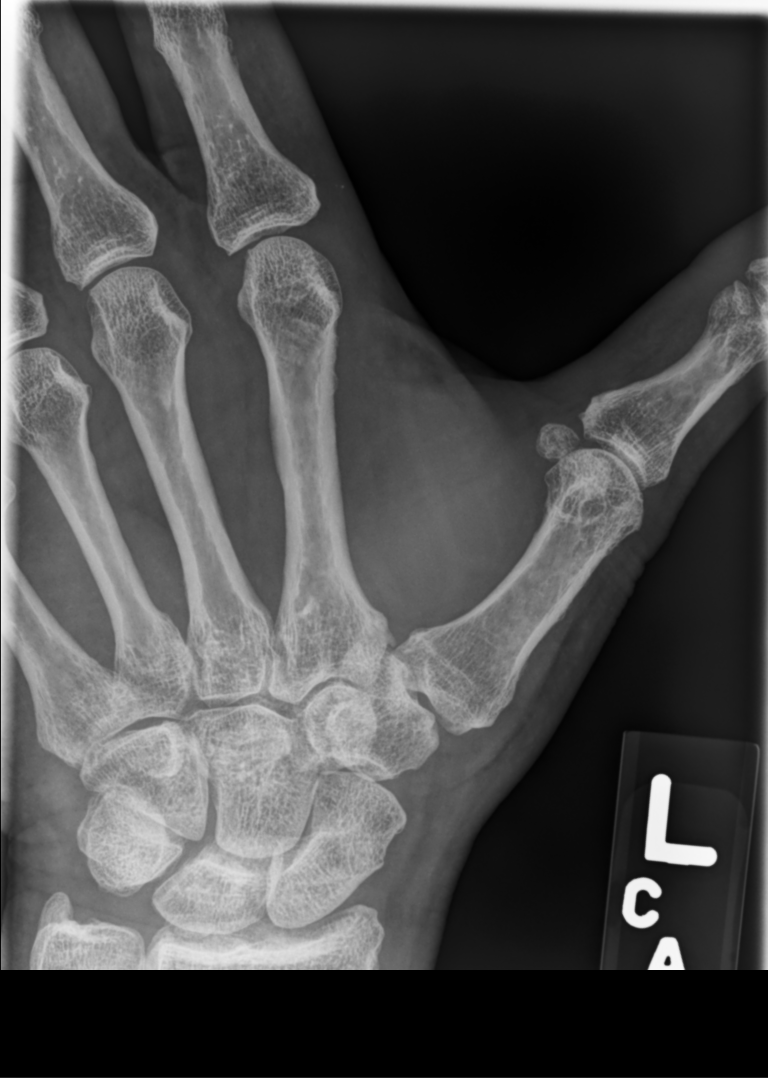

[4 of 4 positions shown; findings below may reference images not displayed]

FINDINGS: Healed fracture with deformity of the left fifth metacarpal
distally. Normal alignment without acute osseous finding or
fracture. Distal radius, ulna, and carpal bones appear intact. Soft
tissue edema noted about the wrist.
IMPRESSION: No acute osseous finding.

Diffuse soft tissue swelling

Healed fracture left fifth metacarpal distally

## 2018-07-28 ENCOUNTER — Ambulatory Visit: Payer: Managed Care, Other (non HMO) | Admitting: Family Medicine

## 2018-07-29 DIAGNOSIS — R1013 Epigastric pain: Secondary | ICD-10-CM | POA: Insufficient documentation

## 2018-07-29 DIAGNOSIS — R351 Nocturia: Secondary | ICD-10-CM | POA: Insufficient documentation

## 2018-07-29 NOTE — Progress Notes (Addendum)
Virtual Visit via Video   Due to the COVID-19 pandemic, this visit was completed with telemedicine (audio/video) technology to reduce patient and provider exposure as well as to preserve personal protective equipment.   I connected with Cesar Green by a video enabled telemedicine application and verified that I am speaking with the correct person using two identifiers. Location patient: Home Location provider: [facility], Office Persons participating in the virtual visit: Million, Maharaj, DO Lonell Grandchild, CMA acting as scribe for Dr. Briscoe Deutscher.   I discussed the limitations of evaluation and management by telemedicine and the availability of in person appointments. The patient expressed understanding and agreed to proceed.  Care Team   Patient Care Team: Briscoe Deutscher, DO as PCP - General (Family Medicine) Milus Banister, MD as Attending Physician (Gastroenterology) Gerda Diss, DO as Consulting Physician (Family Medicine)  Subjective:   HPI:   1. Pure hypercholesterolemia.   Lab Results  Component Value Date   CHOL 173 01/26/2018   HDL 46.30 01/26/2018   LDLCALC 88 01/26/2018   LDLDIRECT 102.0 11/24/2016   TRIG 190.0 (H) 01/26/2018   CHOLHDL 4 01/26/2018   Lab Results  Component Value Date   ALT 19 01/26/2018   AST 14 01/26/2018   ALKPHOS 63 01/26/2018   BILITOT 0.5 01/26/2018    2. Insulin resistance   Lab Results  Component Value Date   HGBA1C 6.0 01/26/2018    3. Nocturia. Controlled.   4. Dyspepsia. Controlled.    5. Chronic pain of right knee. Controlled.    Review of Systems  Constitutional: Negative for chills, fever, malaise/fatigue and weight loss.  Respiratory: Negative for cough, shortness of breath and wheezing.   Cardiovascular: Negative for chest pain, palpitations and leg swelling.  Gastrointestinal: Negative for abdominal pain, constipation, diarrhea, nausea and vomiting.  Genitourinary: Negative for  dysuria and urgency.  Musculoskeletal: Negative for joint pain and myalgias.  Skin: Negative for rash.  Neurological: Negative for dizziness and headaches.  Psychiatric/Behavioral: Negative for depression, substance abuse and suicidal ideas. The patient is not nervous/anxious.     Patient Active Problem List   Diagnosis Date Noted  . Dyspepsia 07/29/2018  . Nocturia 07/29/2018  . Insulin resistance 09/24/2017  . Crushing injury of left wrist 08/11/2016  . Hypotestosteronemia 12/26/2015  . Urinary hesitancy 12/15/2014  . Insomnia 12/23/2013  . Hyperlipidemia 06/01/2013  . Right knee pain 06/01/2013    Social History   Tobacco Use  . Smoking status: Former Research scientist (life sciences)  . Smokeless tobacco: Never Used  . Tobacco comment: Quit 1976  Substance Use Topics  . Alcohol use: Yes    Comment: 2-3oz red wine 3 nights a week    Current Outpatient Medications:  .  aspirin 81 MG tablet, Take 81 mg by mouth every other day. , Disp: , Rfl:  .  DHA-EPA-Vitamin E (OMEGA-3 COMPLEX PO), Take by mouth., Disp: , Rfl:  .  ipratropium (ATROVENT) 0.06 % nasal spray, Place 2 sprays into both nostrils 4 (four) times daily., Disp: 15 mL, Rfl: 0 .  meloxicam (MOBIC) 15 MG tablet, Take 1 tablet by mouth  daily, Disp: 90 tablet, Rfl: 1 .  metFORMIN (GLUCOPHAGE) 1000 MG tablet, Take 1 tablet (1,000 mg total) by mouth 2 (two) times daily with a meal., Disp: 180 tablet, Rfl: 0 .  omeprazole (PRILOSEC) 40 MG capsule, Take 1 capsule (40 mg total) by mouth daily., Disp: 90 capsule, Rfl: 0 .  pravastatin (PRAVACHOL) 80 MG  tablet, Take 1 tablet (80 mg total) by mouth daily., Disp: 90 tablet, Rfl: 2 .  PREVIDENT 5000 DRY MOUTH 1.1 % GEL dental gel, , Disp: , Rfl:  .  Probiotic Product (PROBIOTIC ADVANCED PO), Take by mouth., Disp: , Rfl:  .  tamsulosin (FLOMAX) 0.4 MG CAPS capsule, Take 1 capsule (0.4 mg total) by mouth daily., Disp: 90 capsule, Rfl: 0  Allergies  Allergen Reactions  . Latex Rash  . Testosterone Hives      Objective:   VITALS: Per patient if applicable, see vitals. GENERAL: Alert, appears well and in no acute distress. HEENT: Atraumatic, conjunctiva clear, no obvious abnormalities on inspection of external nose and ears. NECK: Normal movements of the head and neck. CARDIOPULMONARY: No increased WOB. Speaking in clear sentences. I:E ratio WNL.  MS: Moves all visible extremities without noticeable abnormality. PSYCH: Pleasant and cooperative, well-groomed. Speech normal rate and rhythm. Affect is appropriate. Insight and judgement are appropriate. Attention is focused, linear, and appropriate.  NEURO: CN grossly intact. Oriented as arrived to appointment on time with no prompting. Moves both UE equally.  SKIN: No obvious lesions, wounds, erythema, or cyanosis noted on face or hands.  Depression screen [facility] 2/9 01/01/2018 12/29/2016 03/05/2016  Decreased Interest 0 0 0  Down, Depressed, Hopeless 0 0 0  PHQ - 2 Score 0 0 0  Altered sleeping - - 0  Tired, decreased energy - - 0  Change in appetite - - 0  Feeling bad or failure about yourself  - - 0  Trouble concentrating - - 0  Moving slowly or fidgety/restless - - 0  Suicidal thoughts - - 0  PHQ-9 Score - - 0    Assessment and Plan:   Cesar Green was seen today for follow-up.  Diagnoses and all orders for this visit:  Pure hypercholesterolemia Comments: Doing well. Continue current treatment.   Insulin resistance Comments: Doing well. Continue current treatment.   Nocturia Comments: Doing well. Continue current treatment.   Dyspepsia Comments: Doing well. Continue current treatment.   Chronic pain of right knee Comments: Doing well. Continue current treatment.     Marland Kitchen COVID-19 Education:The signs and symptoms of COVID-19 were discussed with the patient and how to seek care for testing if needed. The importance of social distancing was discussed today. . Reviewed expectations re: course of current medical  issues. . Discussed self-management of symptoms. . Outlined signs and symptoms indicating need for more acute intervention. . Patient verbalized understanding and all questions were answered. Marland Kitchen Health Maintenance issues including appropriate healthy diet, exercise, and smoking avoidance were discussed with patient. . See orders for this visit as documented in the electronic medical record.  Briscoe Deutscher, DO  Records requested if needed. Time spent: 25 minutes, of which >50% was spent in obtaining information about his symptoms, reviewing his previous labs, evaluations, and treatments, counseling him about his condition (please see the discussed topics above), and developing a plan to further investigate it; he had a number of questions which I addressed.

## 2018-07-30 ENCOUNTER — Other Ambulatory Visit: Payer: Self-pay

## 2018-07-30 ENCOUNTER — Ambulatory Visit (INDEPENDENT_AMBULATORY_CARE_PROVIDER_SITE_OTHER): Payer: Managed Care, Other (non HMO) | Admitting: Family Medicine

## 2018-07-30 DIAGNOSIS — E8881 Metabolic syndrome: Secondary | ICD-10-CM | POA: Diagnosis not present

## 2018-07-30 DIAGNOSIS — R1013 Epigastric pain: Secondary | ICD-10-CM

## 2018-07-30 DIAGNOSIS — E7800 Pure hypercholesterolemia, unspecified: Secondary | ICD-10-CM

## 2018-07-30 DIAGNOSIS — E88819 Insulin resistance, unspecified: Secondary | ICD-10-CM

## 2018-07-30 DIAGNOSIS — R351 Nocturia: Secondary | ICD-10-CM

## 2018-07-30 DIAGNOSIS — M25561 Pain in right knee: Secondary | ICD-10-CM

## 2018-07-30 DIAGNOSIS — G8929 Other chronic pain: Secondary | ICD-10-CM

## 2018-07-31 ENCOUNTER — Encounter: Payer: Self-pay | Admitting: Family Medicine

## 2018-10-14 ENCOUNTER — Other Ambulatory Visit: Payer: Self-pay

## 2018-10-14 ENCOUNTER — Telehealth: Payer: Self-pay | Admitting: Family Medicine

## 2018-10-14 DIAGNOSIS — E8881 Metabolic syndrome: Secondary | ICD-10-CM

## 2018-10-14 DIAGNOSIS — R1013 Epigastric pain: Secondary | ICD-10-CM

## 2018-10-14 DIAGNOSIS — R351 Nocturia: Secondary | ICD-10-CM

## 2018-10-14 MED ORDER — OMEPRAZOLE 40 MG PO CPDR: 40.0000 mg | DELAYED_RELEASE_CAPSULE | Freq: Every day | ORAL | 0 refills | Status: DC

## 2018-10-14 MED ORDER — METFORMIN HCL 1000 MG PO TABS: 1000.0000 mg | ORAL_TABLET | Freq: Two times a day (BID) | ORAL | 0 refills | Status: DC

## 2018-10-14 MED ORDER — TAMSULOSIN HCL 0.4 MG PO CAPS: 0.4000 mg | ORAL_CAPSULE | Freq: Every day | ORAL | 0 refills | Status: DC

## 2018-10-14 MED ORDER — PRAVASTATIN SODIUM 80 MG PO TABS: 80.0000 mg | ORAL_TABLET | Freq: Every day | ORAL | 2 refills | Status: DC

## 2018-10-14 NOTE — Telephone Encounter (Signed)
Refills sent

## 2018-10-14 NOTE — Telephone Encounter (Signed)
See note

## 2018-10-14 NOTE — Telephone Encounter (Signed)
Medication Refill - Medication: tamsulosin (FLOMAX) 0.4 MG CAPS capsule [[id number]]   omeprazole (PRILOSEC) 40 MG capsule [[id number]]   pravastatin (PRAVACHOL) 80 MG tablet [[id number]]    metFORMIN (GLUCOPHAGE) 1000 MG tablet [[id number]]    Has the patient contacted their pharmacy? No. (Agent: If no, request that the patient contact the pharmacy for the refill.)   Preferred Pharmacy (with phone number or street name):  [facility] [place], [address] [phone] (Phone) [phone] (Fax)     Agent: Please be advised that RX refills may take up to 3 business days. We ask that you follow-up with your pharmacy.

## 2018-10-15 ENCOUNTER — Other Ambulatory Visit: Payer: Self-pay

## 2018-10-15 DIAGNOSIS — E8881 Metabolic syndrome: Secondary | ICD-10-CM

## 2018-10-15 DIAGNOSIS — R351 Nocturia: Secondary | ICD-10-CM

## 2018-10-15 DIAGNOSIS — R1013 Epigastric pain: Secondary | ICD-10-CM

## 2018-10-15 MED ORDER — TAMSULOSIN HCL 0.4 MG PO CAPS: 0.4000 mg | ORAL_CAPSULE | Freq: Every day | ORAL | 0 refills | Status: AC

## 2018-10-15 MED ORDER — METFORMIN HCL 1000 MG PO TABS: 1000.0000 mg | ORAL_TABLET | Freq: Two times a day (BID) | ORAL | 0 refills | Status: AC

## 2018-10-15 MED ORDER — OMEPRAZOLE 40 MG PO CPDR: 40.0000 mg | DELAYED_RELEASE_CAPSULE | Freq: Every day | ORAL | 0 refills | Status: AC

## 2018-10-15 MED ORDER — PRAVASTATIN SODIUM 80 MG PO TABS: 80.0000 mg | ORAL_TABLET | Freq: Every day | ORAL | 2 refills | Status: AC

## 2018-10-15 NOTE — Telephone Encounter (Signed)
Relation to pt: self  Call back number: [phone]  Pharmacy: [facility] [place], [address] [phone] (Phone) [phone] (Fax)     Reason for call:  Patient received a text message from [facility] stating medication request was received. Patient would like [facility] request cancelled and would like a 90 day supply sent to the pharmacy mentioned below. Patient would like a follow up call regarding mail order and request being cancelled.

## 2018-10-15 NOTE — Telephone Encounter (Signed)
°  Reason for call:  Spoke with Jimmy A. from [facility] pharmacist and he confirmed Rx would be cancelled.

## 2018-10-15 NOTE — Telephone Encounter (Signed)
I spoke with patient and he informed that he wanted his medications sent to [facility] in [facility] and cancel the rx sent to [facility].  Rx sent, pt aware.

## 2018-10-15 NOTE — Telephone Encounter (Signed)
See note

## 2018-10-15 NOTE — Telephone Encounter (Signed)
Rx sent to correct pharmacy and the cancelled [facility] orders.

## 2018-12-30 ENCOUNTER — Telehealth: Payer: Self-pay | Admitting: Family Medicine

## 2018-12-30 NOTE — Telephone Encounter (Signed)
I called the patient to reschedule 01/11/2019 AWV.  He said that they have moved to [place], [place] and have a new PCP. VDM (Dee-Dee)

## 2019-01-11 ENCOUNTER — Ambulatory Visit: Payer: Managed Care, Other (non HMO) | Admitting: Family Medicine

## 2019-01-11 ENCOUNTER — Ambulatory Visit: Payer: Managed Care, Other (non HMO)

## 2019-09-23 ENCOUNTER — Other Ambulatory Visit: Payer: Self-pay | Admitting: Family Medicine

## 2019-09-26 NOTE — Telephone Encounter (Signed)
Please review

## 2020-01-20 LAB — HEMOGLOBIN A1C
Estimated Avg Glucose, External: 123 mg/dL
Hemoglobin A1C, External: 5.9 % — ABNORMAL HIGH (ref 4.8–5.6)

## 2020-12-21 LAB — HEMOGLOBIN A1C
Estimated Avg Glucose, External: 126 mg/dL
Hemoglobin A1C, External: 6 % — ABNORMAL HIGH (ref 4.8–5.6)

## 2021-01-22 ENCOUNTER — Ambulatory Visit: Admit: 2021-01-22 | Discharge: 2021-01-22 | Payer: BLUE CROSS/BLUE SHIELD | Attending: Family

## 2021-01-22 DIAGNOSIS — N390 Urinary tract infection, site not specified: Secondary | ICD-10-CM

## 2021-01-22 LAB — AMB POC URINALYSIS DIP STICK AUTO W/O MICRO
Bilirubin, Urine, POC: NEGATIVE
Blood (UA POC): NEGATIVE
Glucose, Urine, POC: NEGATIVE
KETONES, Urine, POC: NEGATIVE
Leukocyte Esterase, Urine, POC: NEGATIVE
Nitrite, Urine, POC: NEGATIVE
Protein, Urine, POC: NEGATIVE
Specific Gravity, Urine, POC: 1.015 (ref 1.001–1.035)
Urobilinogen, POC: 2
pH, Urine, POC: 6.5 (ref 4.6–8.0)

## 2021-01-22 LAB — AMB POC PVR, MEAS,POST-VOID RES,US,NON-IMAGING: PVR, POC: 1336 cc

## 2021-01-22 NOTE — Telephone Encounter (Signed)
CYSTO

## 2021-01-22 NOTE — Progress Notes (Signed)
[facility]  [address]    [address]  [place], [place] [address]  [phone]          Anthony Arias  DOB: May 19, 1950    Chief Complaint   Patient presents with    New Patient    Urinary Tract Infection          HPI     Anthony Arias is a 70 y.o. male being seen today as a new pt referred by PCP for recurrent UTI. Pt reports UTI began early spring of this yr. Sx include foul smelling urine, cloudy urine, dysuria, and UU. Upon chart review, I see 2 positive E.Coli UTI. He was sent for RUS in June dilation of left and right renal pelvis, moderate PVR, and irregular internal margin of bladder.     Today, he reports he is asx. Has UU first thing in AM w sensation of incomplete emptying, however this subsides throughout the day. Nocturia x 0. Stream is good. Denies gross hematuria. Afebrile. Drinks several cups of coffee daily. PVR via u/s 1336 cc.     Has been on flomax since 2018 for stop/start urinary stream per PCP. Has never seen urology in the past. Reports normal DRE/PSA.     PSA 0.5 in Oct 2021. Cr 1.56 in Sept 2022.     No family hx of GU Ca. Former smoker.     Past Medical History:   Diagnosis Date    High cholesterol      Past Surgical History:   Procedure Laterality Date    HERNIA REPAIR       Current Outpatient Medications   Medication Sig Dispense Refill    pravastatin (PRAVACHOL) 80 MG tablet Take 80 mg by mouth daily      tamsulosin (FLOMAX) 0.4 MG capsule Take 0.8 mg by mouth daily      metFORMIN (GLUCOPHAGE) 1000 MG tablet Take 1,000 mg by mouth 2 times daily      aspirin 81 MG chewable tablet Take 81 mg by mouth daily       No current facility-administered medications for this visit.     Allergies   Allergen Reactions    Latex Rash    Testosterone Hives     Social History     Socioeconomic History    Marital status: Married     Spouse name: Not on file    Number of children: Not on file    Years of education: Not on file    Highest education level: Not on file   Occupational History    Not  on file   Tobacco Use    Smoking status: Former     Years: 10.00     Types: Cigarettes    Smokeless tobacco: Never   Substance and Sexual Activity    Alcohol use: Never    Drug use: Not on file    Sexual activity: Not on file   Other Topics Concern    Not on file   Social History Narrative    Not on file     Social Determinants of Health     Financial Resource Strain: Not on file   Food Insecurity: Not on file   Transportation Needs: Not on file   Physical Activity: Not on file   Stress: Not on file   Social Connections: Not on file   Intimate Partner Violence: Not on file   Housing Stability: Not on file     Family History  Problem Relation Age of Onset    Hypertension Father     Heart Disease Father     Diabetes Father     High Cholesterol Father     High Cholesterol Mother     Hypertension Mother     Cancer Mother     Asthma Sister        Review of Systems  Constitutional: Negative  Skin: Negative skin ROS  Eyes: Eyes negative  ENT: HENT negative  Respiratory: Respiratory negative  Cardiovascular: Neg cardio ROS  GI: Neg GI ROS  Genitourinary: Positive for recurrent UTIs and incomplete emptying.  Musculoskeletal: Musculoskeletal negative  Psychological: Neg psych ROS  Endocrine: Endocrine negative  Hem/Lymphatic: Hematologic/lymphatic negativePositive for easy bruising.    Urinalysis  UA - Dipstick  Results for orders placed or performed in visit on 01/22/21   AMB POC URINALYSIS DIP STICK AUTO W/O MICRO   Result Value Ref Range    Color (UA POC)      Clarity (UA POC)      Glucose, Urine, POC Negative Negative    Bilirubin, Urine, POC Negative Negative    KETONES, Urine, POC Negative Negative    Specific Gravity, Urine, POC 1.015 1.001 - 1.035    Blood (UA POC) Negative Negative    pH, Urine, POC 6.5 4.6 - 8.0    Protein, Urine, POC Negative Negative    Urobilinogen, POC 2 mg/dL     Nitrite, Urine, POC Negative Negative    Leukocyte Esterase, Urine, POC Negative Negative       PHYSICAL EXAM    General appearance  - well appearing and in no distress  Mental status - alert, oriented to person, place, and time  Neck - supple, no significant adenopathy  Chest/Lung-  Quiet, even and easy respiratory effort without use of accessory muscles  Skin - normal coloration and turgor, no rashes        Assessment and Plan    ICD-10-CM    1. 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 Urinary retention  R33.9         Likely from large bladder capacity. UA normal today. Will follow.     We discussed proceeding w cysto for further eval. He has no sensation of UU today or pain. ?NGB. Procedure for cysto explained. All questions answered. Will continue flomax for now.     We discussed insertion of indwelling catheter vs CIC. He opts for CIC. Will begin w 3x daily w 14 fr catheter. Due to patient's anatomy and diagnosis of BPH with obstruction, the patient is unable to pass a straight tip catheter and requires a coude tip catheter. He shown how to CIC and demonstrated appropriately. He will increase CIC w output >500 cc. Supplies provided and will be ordered.     F/U pending cysto. To call sooner if needed.     Molli Barrows, APRN - CNP  Dr. Davene Costain is supervising physician today and he approves plan of care.

## 2021-01-30 ENCOUNTER — Ambulatory Visit: Admit: 2021-01-30 | Discharge: 2021-01-30 | Payer: BLUE CROSS/BLUE SHIELD | Attending: Urology

## 2021-01-30 DIAGNOSIS — N401 Enlarged prostate with lower urinary tract symptoms: Secondary | ICD-10-CM

## 2021-01-30 NOTE — Progress Notes (Signed)
[facility]  [address]   [address]  [place], [place] [address]  [phone]    Anthony Arias  DOB: Sep 02, 1950    HPI   70 y.o. Caucasian male who presents for cystoscopy for evaluation of recurrent UTIs.     Referred by PCP for recurrent UTI. Pt reports UTI began early spring of this yr. Sx include foul smelling urine, cloudy urine, dysuria, and UU. Upon chart review, I see 2 positive E.Coli UTI. He was sent for RUS in June dilation of left and right renal pelvis, moderate PVR, and irregular internal margin of bladder.      Today, he reports he is asx. Has UU first thing in AM w sensation of incomplete emptying, however this subsides throughout the day. Nocturia x 0. Stream is good. Denies gross hematuria. Afebrile. Drinks several cups of coffee daily. PVR via u/s 1336 cc.  CIC TID started and has been cathing well.  No leakage of urine between caths or difficulty with caths.  Gets volumes between [phone] cc with caths.      Has been on flomax since 2018 for stop/start urinary stream per PCP. Has never seen urology in the past. Reports normal DRE/PSA.      PSA 0.5 in Oct 2021. Cr 1.56 in Sept 2022.      No family hx of GU Ca. Former smoker.       Past Medical History:   Diagnosis Date    High cholesterol      Past Surgical History:   Procedure Laterality Date    HERNIA REPAIR       Current Outpatient Medications   Medication Sig Dispense Refill    pravastatin (PRAVACHOL) 80 MG tablet Take 80 mg by mouth daily      tamsulosin (FLOMAX) 0.4 MG capsule Take 0.8 mg by mouth daily      metFORMIN (GLUCOPHAGE) 1000 MG tablet Take 1,000 mg by mouth 2 times daily      aspirin 81 MG chewable tablet Take 81 mg by mouth daily       No current facility-administered medications for this visit.     Allergies   Allergen Reactions    Latex Rash    Testosterone Hives     Social History     Socioeconomic History    Marital status: Married     Spouse name: Not on file    Number of children: Not on file    Years of  education: Not on file    Highest education level: Not on file   Occupational History    Not on file   Tobacco Use    Smoking status: Former     Years: 10.00     Types: Cigarettes    Smokeless tobacco: Never   Substance and Sexual Activity    Alcohol use: Never    Drug use: Not on file    Sexual activity: Not on file   Other Topics Concern    Not on file   Social History Narrative    Not on file     Social Determinants of Health     Financial Resource Strain: Not on file   Food Insecurity: Not on file   Transportation Needs: Not on file   Physical Activity: Not on file   Stress: Not on file   Social Connections: Not on file   Intimate Partner Violence: Not on file   Housing Stability: Not on file     Family History  Problem Relation Age of Onset    Hypertension Father     Heart Disease Father     Diabetes Father     High Cholesterol Father     High Cholesterol Mother     Hypertension Mother     Cancer Mother     Asthma Sister        There were no vitals taken for this visit.    UA - Dipstick      UA - Micro  WBC - 0  RBC - 0  Bacteria - 0  Epith - 0    There were no vitals taken for this visit.     GENERAL: No acute distress, Awake, Alert, Oriented X 3, Gait normal  CARDIAC: regular rate and rhythm  CHEST AND LUNG: Easy work of breathing, clear to auscultation bilaterally, no cyanosis  ABDOMEN: soft, non tender, non-distended, positive bowel sounds, no organomegaly, no palpable masses, no guarding, no rebound tenderness  SKIN: No rash, no erythema, no lacerations or abrasions, no ecchymosis  NEUROLOGIC: cranial nerves 2-12 grossly intact         Cystoscopy Procedure:     Procedure Room # 1  Scope ID: [id number]  Assistant: ac      All risks, benefits and alternatives were again reviewed with patient and he is willing to proceed at this time.  His genital area was prepped and draped and a sterile field applied. 2% lidocaine jelly was injected in the the urethra and allowed to dwell for several minutes.  A flexible  cystoscope was then inserted into the urethral meatus and advanced under direct vision.  The anterior and posterior urethra appeared normal in appearance.  The prostatic urethra was mostly open without significant hypertrophy (only very mild lateral lobe laxity amenable to urolift).  The bladder was systematically surveyed.  +3 bladder trabeculations were seen.  Very large bladder capacity consistent with neurogenic bladder.  No mucosal abnormalities were seen.  The ureteral orifices were seen in their normal orthotopic position.  The cystoscope was then removed under direct vision.  The patient tolerated the procedure well.    Assessment and Plan    ICD-10-CM    1. BPH with obstruction/lower urinary tract symptoms  N40.1 CYSTOURETHROSCOPY    N13.8       2. Neurogenic bladder  N31.9       3. Urinary retention  R33.9       4. Recurrent UTI  N39.0         BPH:  Mild based on cysto today    Neurogenic Bladder / Recurrent UTI / Urinary Retention:    Had a long discussion with patient today.  Incomplete bladder emptying is likely UTI cause.  Options are continue CIC 4 times daily with coudet catheter due to BPH anatomy vs. Urodynamics to assess bladder function and see if BOO procedure like urolift would help him.  However, based on mild BOO on cysto, I think that he likely will need to still CIC even after BOO procedure.     After our discussion, he wants to continue CIC 4 times daily + flomax.      Follow up 6 months with NP Batson     Patient had a complete established visit today for neurogenic bladder that is separately identifiable from procedure performed today for recurrent UTI evaluation.  Complete documentation of this separate visit is present.     I have spent 30 minutes today reviewing previous notes, test results and face  to face with the patient as well as documenting.      Jatinder Mcdonagh A. Darrol Poke, M.D.    [facility]  [facility]  [address]  [place], [place]  [address]  Phone: [phone]  Fax: [phone]

## 2021-03-19 NOTE — Telephone Encounter (Signed)
Appointment

## 2021-06-25 LAB — HEMOGLOBIN A1C
Estimated Avg Glucose, External: 128 mg/dL
Hemoglobin A1C, External: 6.1 % — ABNORMAL HIGH (ref 4.8–5.6)

## 2021-07-31 ENCOUNTER — Ambulatory Visit: Admit: 2021-07-31 | Discharge: 2021-07-31 | Payer: BLUE CROSS/BLUE SHIELD | Attending: Family

## 2021-07-31 DIAGNOSIS — N319 Neuromuscular dysfunction of bladder, unspecified: Secondary | ICD-10-CM

## 2021-07-31 LAB — AMB POC PVR, MEAS,POST-VOID RES,US,NON-IMAGING: PVR, POC: 161 cc

## 2021-07-31 NOTE — Progress Notes (Signed)
Anthony Arias  [address]    [address]  [place], [place] [address]  [phone]          Anthony Arias  DOB: 1951/02/28    Chief Complaint   Patient presents with    Follow-up          HPI     Anthony Arias is a 71 y.o. male Referred by PCP for recurrent UTI. Pt reports UTI began early spring of this yr. Sx include foul smelling urine, cloudy urine, dysuria, and UU. Upon chart review, I see 2 positive E.Coli UTI. He was sent for RUS in June dilation of left and right renal pelvis, moderate PVR, and irregular internal margin of bladder. Has UU first thing in AM w sensation of incomplete emptying, however this subsides throughout the day. Nocturia x 0. Stream is good. Denies gross hematuria. Afebrile. Drinks several cups of coffee daily. PVR via u/s 1336 cc.  CIC TID started and has been cathing well.  No leakage of urine between caths or difficulty with caths.  Gets volumes between [phone] cc with caths.   Has been on flomax since 2018 for stop/start urinary stream per PCP.     Had cysto w Dr. Darrol Poke in Oct 2022 showing BPH and very large bladder capacity c/w NGB. He opted to CIC QID w daily flomax. This has worked very well for him. No issues w CIC. No longer measuring volumes.      PSA 0.5 in Oct 2021. Cr 1.56 in Sept 2022.      No family hx of GU Ca. Former smoker.      No urine specimen today. PVR 161 cc vi au/s.         Past Medical History:   Diagnosis Date    High cholesterol      Past Surgical History:   Procedure Laterality Date    HERNIA REPAIR       Current Outpatient Medications   Medication Sig Dispense Refill    aspirin 81 MG chewable tablet Take 1 tablet by mouth daily      pravastatin (PRAVACHOL) 80 MG tablet Take 80 mg by mouth daily      tamsulosin (FLOMAX) 0.4 MG capsule Take 0.8 mg by mouth daily      metFORMIN (GLUCOPHAGE) 1000 MG tablet Take 1,000 mg by mouth 2 times daily       No current facility-administered medications for this visit.     Allergies   Allergen Reactions    Latex  Rash    Testosterone Hives     Social History     Socioeconomic History    Marital status: Married     Spouse name: Not on file    Number of children: Not on file    Years of education: Not on file    Highest education level: Not on file   Occupational History    Not on file   Tobacco Use    Smoking status: Former     Years: 10.00     Types: Cigarettes    Smokeless tobacco: Never   Substance and Sexual Activity    Alcohol use: Never    Drug use: Not on file    Sexual activity: Not on file   Other Topics Concern    Not on file   Social History Narrative    Not on file     Social Determinants of Health     Financial Resource Strain: Not on file  Food Insecurity: Not on file   Transportation Needs: Not on file   Physical Activity: Not on file   Stress: Not on file   Social Connections: Not on file   Intimate Partner Violence: Not on file   Housing Stability: Not on file     Family History   Problem Relation Age of Onset    Hypertension Father     Heart Disease Father     Diabetes Father     High Cholesterol Father     High Cholesterol Mother     Hypertension Mother     Cancer Mother     Asthma Sister        Review of Systems  Constitutional:   Negative for fever.  Genitourinary:  Negative for hematuria.  Musculoskeletal:  Negative for back pain.    Urinalysis  UA - Dipstick  Results for orders placed or performed in visit on 01/22/21   AMB POC URINALYSIS DIP STICK AUTO W/O MICRO   Result Value Ref Range    Color (UA POC)      Clarity (UA POC)      Glucose, Urine, POC Negative Negative    Bilirubin, Urine, POC Negative Negative    KETONES, Urine, POC Negative Negative    Specific Gravity, Urine, POC 1.015 1.001 - 1.035    Blood (UA POC) Negative Negative    pH, Urine, POC 6.5 4.6 - 8.0    Protein, Urine, POC Negative Negative    Urobilinogen, POC 2 mg/dL     Nitrite, Urine, POC Negative Negative    Leukocyte Esterase, Urine, POC Negative Negative       PHYSICAL EXAM    General appearance - well appearing and in no  distress  Mental status - alert, oriented to person, place, and time  Neck - supple, no significant adenopathy  Chest/Lung-  Quiet, even and easy respiratory effort without use of accessory muscles  Skin - normal coloration and turgor, no rashes        Assessment and Plan    ICD-10-CM    1. Neurogenic bladder  N31.9       2. 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 Recurrent UTI  N39.0 AMB POC PVR, MEAS,POST-VOID RES,US,NON-IMAGING     CANCELED: AMB POC URINALYSIS DIP STICK AUTO W/O MICRO      Continue CIC QID w 14 fr coude. Due to patient's anatomy and diagnosis of BPH with obstruction, the patient is unable to pass a straight tip catheter and requires a coude tip catheter.      Flomax daily. PSA today. Will call results.    No UTI sx. Will follow.     ROV in 1 yr w PSA I week prior. To call sooner if needed.     Molli Barrows, APRN - CNP  Dr. Davene Costain is supervising physician today and he approves plan of care.

## 2021-08-01 LAB — PSA, DIAGNOSTIC: PSA: 0.6 ng/mL (ref <4.0)

## 2021-08-01 NOTE — Telephone Encounter (Signed)
Informed pt that PSA normal. Repeat in 1 yr.   07/24/22

## 2021-09-13 ENCOUNTER — Encounter: Payer: Self-pay | Admitting: Gastroenterology

## 2021-12-25 LAB — HEMOGLOBIN A1C
Estimated Avg Glucose, External: 128 mg/dL
Hemoglobin A1C, External: 6.1 % — ABNORMAL HIGH (ref 4.8–5.6)

## 2022-07-24 ENCOUNTER — Encounter

## 2022-07-24 ENCOUNTER — Encounter: Admit: 2022-07-24 | Discharge: 2022-07-24 | Payer: BLUE CROSS/BLUE SHIELD

## 2022-07-24 DIAGNOSIS — N138 Other obstructive and reflux uropathy: Secondary | ICD-10-CM

## 2022-07-25 LAB — PSA, DIAGNOSTIC: PSA: 0.8 ng/mL (ref <4.0)

## 2022-08-01 ENCOUNTER — Ambulatory Visit: Admit: 2022-08-01 | Discharge: 2022-08-01 | Payer: BLUE CROSS/BLUE SHIELD | Attending: Family

## 2022-08-01 ENCOUNTER — Encounter: Payer: BLUE CROSS/BLUE SHIELD | Attending: Family

## 2022-08-01 DIAGNOSIS — N319 Neuromuscular dysfunction of bladder, unspecified: Secondary | ICD-10-CM

## 2022-08-01 NOTE — Progress Notes (Signed)
[facility]  [address]   [address]  [place], [place] [address]  [phone]    Anthony Arias  DOB: [birth year]    Chief Complaint   Patient presents with    Follow-up          HPI     Anthony Arias is a 72 y.o. male referred by PCP for recurrent UTI. Pt reports UTI began early spring of this yr. Sx include foul smelling urine, cloudy urine, dysuria, and UU. E.Coli UTI. He was sent for RUS in June 22 showing dilation of left and right renal pelvis, moderate PVR, and irregular internal margin of bladder. Has UU first thing in AM w sensation of incomplete emptying, however this subsides throughout the day. Nocturia x 0. Stream is good.. Drinks several cups of coffee daily. PVR via u/s 1336 cc.  CIC TID started and has been cathing well.  No leakage of urine between caths or difficulty with caths.  Gets volumes between [phone] cc with caths. Has been on flomax since 2018 for stop/start urinary stream per PCP. Had cysto w Dr. Darrol Poke in Oct 2022 showing BPH and very large bladder capacity c/w NGB. All options were discussed. He opted to CIC 5x daily w daily flomax. This has worked very well for him. No issues w CIC. No longer measuring volumes. Asx. No recent UTI.      Lab Results   Component Value Date    PSA 0.8 07/24/2022    PSA 0.6 07/31/2021     Cr 1.47. Followed by nephrology.       No family hx of GU Ca. Former smoker.                Past Medical History:   Diagnosis Date    High cholesterol      Past Surgical History:   Procedure Laterality Date    HERNIA REPAIR       Current Outpatient Medications   Medication Sig Dispense Refill    aspirin 81 MG chewable tablet Take 1 tablet by mouth daily      pravastatin (PRAVACHOL) 80 MG tablet Take 80 mg by mouth daily      tamsulosin (FLOMAX) 0.4 MG capsule Take 0.8 mg by mouth daily      metFORMIN (GLUCOPHAGE) 1000 MG tablet Take 1,000 mg by mouth 2 times daily       No current facility-administered medications for this visit.     Allergies   Allergen  Reactions    Latex Rash    Testosterone Hives     Social History     Socioeconomic History    Marital status: Married     Spouse name: Not on file    Number of children: Not on file    Years of education: Not on file    Highest education level: Not on file   Occupational History    Not on file   Tobacco Use    Smoking status: Former     Types: Cigarettes    Smokeless tobacco: Never   Substance and Sexual Activity    Alcohol use: Never    Drug use: Not on file    Sexual activity: Not on file   Other Topics Concern    Not on file   Social History Narrative    Not on file     Social Determinants of Health     Financial Resource Strain: Not on file   Food Insecurity: Not on  file   Transportation Needs: Not on file   Physical Activity: Not on file   Stress: Not on file   Social Connections: Not on file   Intimate Partner Violence: Not on file   Housing Stability: Not on file     Family History   Problem Relation Age of Onset    Hypertension Father     Heart Disease Father     Diabetes Father     High Cholesterol Father     High Cholesterol Mother     Hypertension Mother     Cancer Mother     Asthma Sister        ROS    Urinalysis  UA - Dipstick  No results found for this visit on 08/01/22.    PHYSICAL EXAM    General appearance - well appearing and in no distress  Mental status - alert, oriented to person, place, and time  Neck - supple, no significant adenopathy  Chest/Lung-  Quiet, even and easy respiratory effort without use of accessory muscles  Skin - normal coloration and turgor, no rashes      Assessment and Plan    ICD-10-CM    1. Neurogenic bladder  N31.9       2. 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 UTI sx discussed.     Cont flomax. PSA in 1 yr.     ROV in 1 yr. To call sooner if needed.     Molli Barrows, APRN - CNP  Dr. Davene Costain is supervising physician today and he approves plan of care.

## 2022-09-26 NOTE — Telephone Encounter (Signed)
Medicaid for insurance.  [facility] need progress medical notes. Send office notes?

## 2022-09-26 NOTE — Telephone Encounter (Signed)
Faxed to [facility]

## 2022-09-30 NOTE — Telephone Encounter (Addendum)
[place] called   Phone [phone] opt 5 then 2  Fax [phone]  To inform office notes would need to be addend so that medicaid can approve caths.  - Pt have permanent urine retention/ incontinence.  -Why coude is needed instead of straight tip? Ex: straight tip not able to pass through prostate.  -How coude will be used how many time etc

## 2022-09-30 NOTE — Telephone Encounter (Signed)
Faxed to Fax [phone]

## 2022-10-01 NOTE — Telephone Encounter (Signed)
[place] called   Phone [phone] opt 5 then 2  Fax [phone]  Called to explain they need an addendum on AVS saying the pt need has permanent incontinence or retention.

## 2022-10-03 NOTE — Telephone Encounter (Signed)
Faxed again to [phone]

## 2022-10-17 LAB — HEMOGLOBIN A1C
Estimated Avg Glucose, External: 128 mg/dL
Hemoglobin A1C, External: 6.1 % — ABNORMAL HIGH (ref 4.8–5.6)

## 2023-07-23 ENCOUNTER — Other Ambulatory Visit: Admit: 2023-07-23 | Discharge: 2023-07-23 | Payer: MEDICARE

## 2023-07-23 DIAGNOSIS — N401 Enlarged prostate with lower urinary tract symptoms: Secondary | ICD-10-CM

## 2023-07-23 DIAGNOSIS — N138 Other obstructive and reflux uropathy: Secondary | ICD-10-CM

## 2023-07-23 LAB — PSA, DIAGNOSTIC: PSA: 0.6 ng/mL (ref 0.0–4.0)

## 2023-07-31 ENCOUNTER — Ambulatory Visit: Admit: 2023-07-31 | Discharge: 2023-07-31 | Payer: BLUE CROSS/BLUE SHIELD | Attending: Family

## 2023-07-31 DIAGNOSIS — N319 Neuromuscular dysfunction of bladder, unspecified: Secondary | ICD-10-CM

## 2023-07-31 LAB — AMB POC URINALYSIS DIP STICK AUTO W/O MICRO
Bilirubin, Urine, POC: NEGATIVE
Glucose, Urine, POC: NEGATIVE mg/dL
KETONES, Urine, POC: NEGATIVE mg/dL
Nitrite, Urine, POC: NEGATIVE
Protein, Urine, POC: NEGATIVE mg/dL
Specific Gravity, Urine, POC: 1.02 (ref 1.001–1.035)
Urobilinogen, POC: 0.2 mg/dL (ref <1.1)
pH, Urine, POC: 5.5 (ref 4.6–8.0)

## 2023-07-31 MED ORDER — TAMSULOSIN HCL 0.4 MG PO CAPS
0.4 | ORAL_CAPSULE | Freq: Every day | ORAL | 3 refills | 30.00000 days | Status: AC
Start: 2023-07-31 — End: ?

## 2023-07-31 NOTE — Progress Notes (Signed)
 [facility]  [address]    [address]  [place], [place] [address]  [phone]          Anthony Arias  DOB: 11-Apr-1950    Chief Complaint   Patient presents with    Follow-up     PSA fu          HPI     Anthony Arias is a 73 y.o. male referred by PCP for recurrent UTI. Pt reports UTI began early spring of this yr. Sx include foul smelling urine, cloudy urine, dysuria, and UU. E.Coli UTI. He was sent for RUS in June 22 showing dilation of left and right renal pelvis, moderate PVR, and irregular internal margin of bladder. Has UU first thing in AM w sensation of incomplete emptying, however this subsides throughout the day. Nocturia x 0. Stream is good.. Drinks several cups of coffee daily. PVR via u/s 1336 cc.  CIC TID started and has been cathing well.  No leakage of urine between caths or difficulty with caths.  Gets volumes between [phone] cc with caths. Has been on flomax since 2018 for stop/start urinary stream per PCP. Had cysto w Dr. Celestino Cole in Oct 2022 showing BPH and very large bladder capacity c/w NGB. All options were discussed. He opted to CIC 5x w coloplast speedicath daily w daily flomax. This has worked very well for him. No issues w CIC. No longer measuring volumes. Asx. No recent UTI. He is doing very well.   Lab Results   Component Value Date    PSA 0.6 07/23/2023    PSA 0.8 07/24/2022    PSA 0.6 07/31/2021     Cr 1.64. Followed by nephrology.     No family hx of GU Ca. Former smoker.            Past Medical History:   Diagnosis Date    High cholesterol      Past Surgical History:   Procedure Laterality Date    HERNIA REPAIR       Current Outpatient Medications   Medication Sig Dispense Refill    tamsulosin (FLOMAX) 0.4 MG capsule Take 1 capsule by mouth daily 90 capsule 3    pravastatin (PRAVACHOL) 80 MG tablet Take 80 mg by mouth daily      metFORMIN (GLUCOPHAGE) 1000 MG tablet Take 1 tablet by mouth Daily with lunch       No current facility-administered medications for this  visit.     Allergies   Allergen Reactions    Latex Rash    Testosterone Hives     Social History     Socioeconomic History    Marital status: Divorced     Spouse name: Not on file    Number of children: Not on file    Years of education: Not on file    Highest education level: Not on file   Occupational History    Not on file   Tobacco Use    Smoking status: Former     Types: Cigarettes    Smokeless tobacco: Never   Substance and Sexual Activity    Alcohol use: Never    Drug use: Not on file    Sexual activity: Not on file   Other Topics Concern    Not on file   Social History Narrative    Not on file     Social Drivers of Health     Financial Resource Strain: Low Risk  (06/18/2023)  Received from [facility] Resource Strain     Difficulty Paying Living Expenses: Not hard at all     Difficulty Paying Medical Expenses: No   Food Insecurity: No Food Insecurity (06/18/2023)    Received from [facility]    Food Insecurity     Worried about Running Out of Food in the Last Year: Never true     Ran Out of Food in the Last Year: Never true   Transportation Needs: No Transportation Needs (06/18/2023)    Received from [facility] Needs     Lack of Transportation: No   Physical Activity: Sufficiently Active (12/29/2022)    Received from [facility]    Physical Activity     Days of Exercise per Week: 3 days     On average, how many minutes do you exercise per day?: 50     Total Minutes of Exercise Per Week: 150   Stress: No Stress Concern Present (06/18/2023)    Received from [facility]    Stress     Feeling of Stress : Only a little   Social Connections: Moderately Integrated (06/18/2023)    Received from [facility]    Social Connections     Frequency of Communication with Friends and Family: More than three times a week     Frequency of Social Gatherings with Friends and Family: Once a week   Intimate Partner Violence: Not At Risk (06/18/2023)    Received from [facility]    Intimate  Partner Violence     Fear of Current or Ex-Partner: No     Emotionally Abused: No     Physically Abused: No     Sexually Abused: No   Housing Stability: Not At Risk (06/18/2023)    Received from [facility] Stability     Was there a time when you did not have a steady place to sleep: No     Worried that the place you are staying is making you sick: No     Family History   Problem Relation Age of Onset    Hypertension Father     Heart Disease Father     Diabetes Father     High Cholesterol Father     High Cholesterol Mother     Hypertension Mother     Cancer Mother     Asthma Sister        Review of Systems  Constitutional:   Negative for fever.  Genitourinary:  Negative for hematuria.  Musculoskeletal:  Negative for back pain.      Urinalysis  UA - Dipstick  Results for orders placed or performed in visit on 07/31/23   AMB POC URINALYSIS DIP STICK AUTO W/O MICRO    Collection Time: 07/31/23 11:20 AM   Result Value Ref Range    Color (UA POC)      Clarity (UA POC)      Glucose, Urine, POC Negative Negative mg/dL    Bilirubin, Urine, POC Negative Negative    KETONES, Urine, POC Negative Negative mg/dL    Specific Gravity, Urine, POC 1.020 1.001 - 1.035    Blood (UA POC) Trace-intact     pH, Urine, POC 5.5 4.6 - 8.0    Protein, Urine, POC Negative Negative mg/dL    Urobilinogen, POC 0.2 mg/dL <8.2 mg/dL    Nitrite, Urine, POC Negative Negative    Leukocyte Esterase, Urine, POC Small Negative  PHYSICAL EXAM    General appearance - well appearing and in no distress  Mental status - alert, oriented to person, place, and time  Neck - supple, no significant adenopathy  Chest/Lung-  Quiet, even and easy respiratory effort without use of accessory muscles  Rectal/DRE- PT DECLINED   Skin - normal coloration and turgor, no rashes      Assessment and Plan    ICD-10-CM    1. Neurogenic bladder  N31.9 AMB POC URINALYSIS DIP STICK AUTO W/O MICRO     tamsulosin (FLOMAX) 0.4 MG capsule      2. Chronic retention of  urine  R33.9 tamsulosin (FLOMAX) 0.4 MG capsule      3. BPH with obstruction/lower urinary tract symptoms  N40.1 tamsulosin (FLOMAX) 0.4 MG capsule    N13.8       Cont CIC x 5. Refill flomax. He prefers for PCP to check PSA. He is aware to call w abnormal values.     ROV in 1 yr. To call sooner if needed.     Lalla Pill, APRN - CNP  Dr. Fayrene Hope is supervising physician today and he approves plan of care.
